# Patient Record
Sex: Male | Born: 1935 | Race: White | Hispanic: No | Marital: Married | Smoking: Never smoker
Health system: Southern US, Community
[De-identification: ages and names within clinical notes are randomized; demographics above are authoritative.]

## PROBLEM LIST (undated history)

## (undated) DIAGNOSIS — F0390 Unspecified dementia without behavioral disturbance: Secondary | ICD-10-CM

## (undated) DIAGNOSIS — G4733 Obstructive sleep apnea (adult) (pediatric): Secondary | ICD-10-CM

## (undated) DIAGNOSIS — I10 Essential (primary) hypertension: Secondary | ICD-10-CM

---

## 2009-11-17 ENCOUNTER — Ambulatory Visit: Payer: Self-pay | Admitting: Internal Medicine

## 2009-12-28 ENCOUNTER — Emergency Department: Payer: Self-pay | Admitting: Emergency Medicine

## 2011-09-25 ENCOUNTER — Emergency Department: Payer: Self-pay | Admitting: *Deleted

## 2011-09-25 LAB — COMPREHENSIVE METABOLIC PANEL
Anion Gap: 8 (ref 7–16)
BUN: 29 mg/dL — ABNORMAL HIGH (ref 7–18)
Bilirubin,Total: 1.5 mg/dL — ABNORMAL HIGH (ref 0.2–1.0)
Chloride: 104 mmol/L (ref 98–107)
Co2: 27 mmol/L (ref 21–32)
Creatinine: 1.22 mg/dL (ref 0.60–1.30)
EGFR (African American): >60
EGFR (Non-African Amer.): 52 — ABNORMAL LOW
Osmolality: 285 (ref 275–301)
Potassium: 4 mmol/L (ref 3.5–5.1)
SGPT (ALT): 19 U/L (ref 12–78)
Sodium: 139 mmol/L (ref 136–145)
Total Protein: 6.9 g/dL (ref 6.4–8.2)

## 2011-09-25 LAB — URINALYSIS, COMPLETE
Bilirubin,UR: NEGATIVE
Blood: NEGATIVE
Glucose,UR: NEGATIVE mg/dL (ref 0–75)
Leukocyte Esterase: NEGATIVE
Ph: 6 (ref 4.5–8.0)
Specific Gravity: 1.015 (ref 1.003–1.030)
WBC UR: <1 /HPF (ref 0–5)

## 2011-09-25 LAB — CBC WITH DIFFERENTIAL/PLATELET
Basophil %: 0.5 %
Eosinophil %: 0.4 %
HGB: 13.9 g/dL (ref 13.0–18.0)
Lymphocyte #: 0.7 10*3/uL — ABNORMAL LOW (ref 1.0–3.6)
MCH: 33.6 pg (ref 26.0–34.0)
Monocyte #: 0.4 x10 3/mm (ref 0.2–1.0)
Monocyte %: 5.1 %
Neutrophil #: 7.3 10*3/uL — ABNORMAL HIGH (ref 1.4–6.5)
Neutrophil %: 85.9 %
RBC: 4.14 10*6/uL — ABNORMAL LOW (ref 4.40–5.90)
WBC: 8.6 10*3/uL (ref 3.8–10.6)

## 2013-06-09 ENCOUNTER — Encounter: Payer: Self-pay | Admitting: Surgery

## 2014-01-03 ENCOUNTER — Emergency Department: Payer: Self-pay | Admitting: Emergency Medicine

## 2014-01-03 LAB — URINALYSIS, COMPLETE
BILIRUBIN, UR: NEGATIVE
Bacteria: NONE SEEN
GLUCOSE, UR: NEGATIVE mg/dL (ref 0–75)
KETONE: NEGATIVE
LEUKOCYTE ESTERASE: NEGATIVE
NITRITE: NEGATIVE
Ph: 6 (ref 4.5–8.0)
Protein: 30
RBC,UR: 44 /HPF (ref 0–5)
Specific Gravity: 1.016 (ref 1.003–1.030)
WBC UR: 1 /HPF (ref 0–5)

## 2014-01-03 LAB — CBC
HCT: 39 % — ABNORMAL LOW (ref 40.0–52.0)
HGB: 12.9 g/dL — ABNORMAL LOW (ref 13.0–18.0)
MCH: 32.3 pg (ref 26.0–34.0)
MCHC: 33.2 g/dL (ref 32.0–36.0)
MCV: 97 fL (ref 80–100)
PLATELETS: 234 10*3/uL (ref 150–440)
RBC: 4 10*6/uL — AB (ref 4.40–5.90)
RDW: 12.8 % (ref 11.5–14.5)
WBC: 9.9 10*3/uL (ref 3.8–10.6)

## 2014-01-03 LAB — TROPONIN I: TROPONIN-I: 0.04 ng/mL

## 2014-01-03 LAB — COMPREHENSIVE METABOLIC PANEL
ALK PHOS: 96 U/L
ALT: 18 U/L
ANION GAP: 7 (ref 7–16)
AST: 23 U/L (ref 15–37)
Albumin: 2.6 g/dL — ABNORMAL LOW (ref 3.4–5.0)
BUN: 27 mg/dL — ABNORMAL HIGH (ref 7–18)
Bilirubin,Total: 2 mg/dL — ABNORMAL HIGH (ref 0.2–1.0)
CO2: 30 mmol/L (ref 21–32)
Calcium, Total: 8.6 mg/dL (ref 8.5–10.1)
Chloride: 106 mmol/L (ref 98–107)
Creatinine: 1.26 mg/dL (ref 0.60–1.30)
EGFR (African American): >60
EGFR (Non-African Amer.): 57 — ABNORMAL LOW
Glucose: 112 mg/dL — ABNORMAL HIGH (ref 65–99)
OSMOLALITY: 291 (ref 275–301)
Potassium: 3.9 mmol/L (ref 3.5–5.1)
Sodium: 143 mmol/L (ref 136–145)
Total Protein: 6.9 g/dL (ref 6.4–8.2)

## 2014-01-05 LAB — URINE CULTURE

## 2014-04-10 ENCOUNTER — Emergency Department: Payer: Self-pay | Admitting: Emergency Medicine

## 2015-02-16 ENCOUNTER — Encounter: Payer: Self-pay | Admitting: Emergency Medicine

## 2015-02-16 ENCOUNTER — Emergency Department
Admission: EM | Admit: 2015-02-16 | Discharge: 2015-02-17 | Disposition: A | Discharge status: Home / Self Care | Payer: Medicare Other | Attending: Emergency Medicine | Admitting: Emergency Medicine

## 2015-02-16 ENCOUNTER — Emergency Department: Payer: Medicare Other

## 2015-02-16 DIAGNOSIS — X58XXXA Exposure to other specified factors, initial encounter: Secondary | ICD-10-CM | POA: Insufficient documentation

## 2015-02-16 DIAGNOSIS — S82392A Other fracture of lower end of left tibia, initial encounter for closed fracture: Secondary | ICD-10-CM | POA: Insufficient documentation

## 2015-02-16 DIAGNOSIS — I10 Essential (primary) hypertension: Secondary | ICD-10-CM | POA: Diagnosis not present

## 2015-02-16 DIAGNOSIS — Y9389 Activity, other specified: Secondary | ICD-10-CM | POA: Diagnosis not present

## 2015-02-16 DIAGNOSIS — Z79899 Other long term (current) drug therapy: Secondary | ICD-10-CM | POA: Insufficient documentation

## 2015-02-16 DIAGNOSIS — S8992XA Unspecified injury of left lower leg, initial encounter: Secondary | ICD-10-CM | POA: Diagnosis present

## 2015-02-16 DIAGNOSIS — Z7982 Long term (current) use of aspirin: Secondary | ICD-10-CM | POA: Insufficient documentation

## 2015-02-16 DIAGNOSIS — Y9289 Other specified places as the place of occurrence of the external cause: Secondary | ICD-10-CM | POA: Insufficient documentation

## 2015-02-16 DIAGNOSIS — Y998 Other external cause status: Secondary | ICD-10-CM | POA: Insufficient documentation

## 2015-02-16 DIAGNOSIS — S8012XA Contusion of left lower leg, initial encounter: Secondary | ICD-10-CM | POA: Diagnosis not present

## 2015-02-16 DIAGNOSIS — S82402A Unspecified fracture of shaft of left fibula, initial encounter for closed fracture: Secondary | ICD-10-CM | POA: Diagnosis not present

## 2015-02-16 DIAGNOSIS — S82202A Unspecified fracture of shaft of left tibia, initial encounter for closed fracture: Secondary | ICD-10-CM

## 2015-02-16 HISTORY — DX: Obstructive sleep apnea (adult) (pediatric): G47.33

## 2015-02-16 HISTORY — DX: Unspecified dementia, unspecified severity, without behavioral disturbance, psychotic disturbance, mood disturbance, and anxiety: F03.90

## 2015-02-16 HISTORY — DX: Essential (primary) hypertension: I10

## 2015-02-16 NOTE — ED Notes (Signed)
Return from XRay

## 2015-02-16 NOTE — ED Provider Notes (Signed)
[facility] Emergency Department Provider Note  ____________________________________________  Time seen: 11:30 PM  I have reviewed the triage vital signs and the nursing notes.   HISTORY  Chief Complaint Leg Injury      HPI Seth Phillips is a 81 y.o. male presents from nursing home with history of acute onset of left leg pain while pivoting. Staff from the nursing home said he noted immediate bruising and deformity. Patient denies any pain at this     Past Medical History  Diagnosis Date  . Dementia   . Hypertension   . OSA (obstructive sleep apnea)     There are no active problems to display for this patient.   History reviewed. No pertinent past surgical history.  Current Outpatient Rx  Name  Route  Sig  Dispense  Refill  . aspirin 81 MG chewable tablet   Oral   Chew 81 mg by mouth daily.         Marland Kitchen. lisinopril (PRINIVIL,ZESTRIL) 10 MG tablet   Oral   Take 10 mg by mouth daily.         . Vitamin D, Ergocalciferol, (DRISDOL) 50000 units CAPS capsule   Oral   Take 50,000 Units by mouth every 7 (seven) days.           Allergies Codeine; Sulfa antibiotics; Vancomycin; and Vicodin  History reviewed. No pertinent family history.  Social History Social History  Substance Use Topics  . Smoking status: Unknown If Ever Smoked  . Smokeless tobacco: None  . Alcohol Use: No    Review of Systems  Constitutional: Negative for fever. Eyes: Negative for visual changes. ENT: Negative for sore throat. Cardiovascular: Negative for chest pain. Respiratory: Negative for shortness of breath. Gastrointestinal: Negative for abdominal pain, vomiting and diarrhea. Genitourinary: Negative for dysuria. Musculoskeletal: Negative for back pain. Positive for left leg pain Skin: Negative for rash. Neurological: Negative for headaches, focal weakness or numbness.   10-point ROS otherwise  negative.  ____________________________________________   PHYSICAL EXAM:  VITAL SIGNS: ED Triage Vitals  Enc Vitals Group     BP 02/16/15 2235 142/96 mmHg     Pulse Rate 02/16/15 2235 94     Resp 02/16/15 2235 18     Temp 02/16/15 2235 97.6 F (36.4 C)     Temp src --      SpO2 02/16/15 2235 98 %     Weight 02/16/15 2235 165 lb (74.844 kg)     Height 02/16/15 2235 5\' 7"  (1.702 m)     Head Cir --      Peak Flow --      Pain Score --      Pain Loc --      Pain Edu? --      Excl. in GC? --     Constitutional: Alert and oriented. Well appearing and in no distress. Eyes: Conjunctivae are normal. PERRL. Normal extraocular movements. ENT   Head: Normocephalic and atraumatic.   Nose: No congestion/rhinnorhea.   Mouth/Throat: Mucous membranes are moist.   Neck: No stridor. Hematological/Lymphatic/Immunilogical: No cervical lymphadenopathy. Cardiovascular: Normal rate, regular rhythm. Normal and symmetric distal pulses are present in all extremities. No murmurs, rubs, or gallops. Respiratory: Normal respiratory effort without tachypnea nor retractions. Breath sounds are clear and equal bilaterally. No wheezes/rales/rhonchi. Gastrointestinal: Soft and nontender. No distention. There is no CVA tenderness. Genitourinary: deferred Musculoskeletal: Left leg pain and tenderness to palpation proximal and distal  Neurologic:  Normal speech and language. No gross  focal neurologic deficits are appreciated. Speech is normal.  Skin:  Skin is warm, dry and intact. No rash noted. Psychiatric: Mood and affect are normal. Speech and behavior are normal. Patient exhibits appropriate insight and judgment.      RADIOLOGY   DG Tibia/Fibula Left (Final result) Result time: 02/16/15 23:33:44   Final result by Rad Results In Interface (02/16/15 23:33:44)   Narrative:   CLINICAL DATA: Heard pop at left lower leg with deformity and bruising, when pivoting. Initial  encounter.  EXAM: LEFT TIBIA AND FIBULA - 2 VIEW  COMPARISON: Left ankle radiographs performed 01/03/2014  FINDINGS: There are mildly displaced oblique fractures involving the distal tibial diaphysis and proximal fibular diaphysis. There is lateral and anterior displacement of the tibial fracture, and medial displacement at the fibular fracture. Mild posterior angulation of the distal tibia is noted. Mild soft tissue swelling is noted.  There is degenerative change at the left knee, with lateral compartment narrowing and sclerotic change. The ankle mortise is grossly unremarkable in appearance. Mild degenerative change is noted at the midfoot. A posterior calcaneal spur is incidentally seen.  IMPRESSION: 1. Mildly displaced oblique fractures involving the distal tibial diaphysis and proximal fibular diaphysis. Lateral and anterior angulation of the tibial fracture, and medial displacement of the fibular fracture. Mild posterior angulation of the distal tibia. 2. Degenerative change at the left knee, with lateral compartment narrowing and sclerotic change. Mild degenerative change at the midfoot.   Electronically Signed By: Roanna Raider M.D. On: 02/16/2015 23:32            INITIAL IMPRESSION / ASSESSMENT AND PLAN / ED COURSE  Pertinent labs & imaging results that were available during my care of the patient were reviewed by me and considered in my medical decision making (see chart for details).    ____________________________________________   FINAL CLINICAL IMPRESSION(S) / ED DIAGNOSES  Final diagnoses:  Closed left tibial fracture, initial encounter  Closed left fibular fracture, initial encounter      Darci Current, MD 02/19/15 [phone]

## 2015-02-16 NOTE — ED Notes (Signed)
Pt was being ambulated back to bed by staff at nh - pivoted pt and heard a pop in his leg with deformity and immediate bruising

## 2015-02-17 NOTE — Discharge Instructions (Signed)
Tibial and Fibular Fracture, Adult °Tibial and fibular fracture is a break in the bones of your lower leg (tibia and fibula). The tibia is the larger of these two bones. The fibula is the smaller of the two bones. It is on the outer side of your leg.  °CAUSES °· Low-energy injuries, such as a fall from ground level. °· High-energy injuries, such as motor vehicle injuries, gunshot wounds, or high-speed sports collisions. °RISK FACTORS °· Jumping activities. °· Repetitive stress, such as long-distance running. °· Participation in sports. °· Osteoporosis. °· Advanced age. °SIGNS AND SYMPTOMS °· Pain. °· Swelling. °· Inability to put weight on your injured leg. °· Bone deformities at the site of your injury. °· Bruising. °DIAGNOSIS  °Tibial and fibular fractures are diagnosed with the use of X-ray exams. °TREATMENT  °If you have a simple fracture of these two bones, they can be treated with simple immobilization. A cast or splint will be used on your leg to keep it from moving while it heals. Then you can begin range-of-motion exercises to regain your knee motion. °HOME CARE INSTRUCTIONS  °· Apply ice to your leg: °¨ Put ice in a plastic bag. °¨ Place a towel between your skin and the bag. °¨ Leave the ice on for 20 minutes, 2-3 times a day. °· If you have a plaster or fiberglass cast: °¨ Do not try to scratch the skin under the cast using sharp or pointed objects. °¨ Check the skin around the cast every day. You may put lotion on any red or sore areas. °¨ Keep your cast dry and clean. °· If you have a plaster splint: °¨ Wear the splint as directed. °¨ You may loosen the elastic around the splint if your toes become numb, tingle, or turn cold or blue. °· Do not put pressure on any part of your cast or splint until it is fully hardened, because it may deform. °· Your cast or splint can be protected during bathing with a plastic bag. Do not lower the cast or splint into water. °· Use crutches as directed. °· Only take  over-the-counter or prescription medicines for pain, discomfort, or fever as directed by your health care provider. °· Follow all instructions given to you by your health care provider. °· Make and keep all follow-up appointments. °SEEK MEDICAL CARE IF: °· Your pain is becoming worse rather than better or is not controlled with medicines. °· You have increased swelling or redness in the foot. °· You begin to lose feeling in your foot or toes. °SEEK IMMEDIATE MEDICAL CARE IF: °· You develop a cold or blue foot or toes on the injured side. °· You develop severe pain in your injured leg, especially if the pain is increased with movement of your toes. °MAKE SURE YOU: °· Understand these instructions. °· Will watch your condition. °· Will get help right away if you are not doing well or get worse. °  °This information is not intended to replace advice given to you by your health care provider. Make sure you discuss any questions you have with your health care provider. °  °Document Released: 10/27/2001 Document Revised: 06/21/2014 Document Reviewed: 09/16/2012 °Elsevier Interactive Patient Education ©2016 [employer] ° °

## 2015-02-17 NOTE — ED Notes (Signed)
Awake and alert.  NAD.  D/C home with EMS

## 2015-02-25 ENCOUNTER — Emergency Department

## 2015-02-25 ENCOUNTER — Inpatient Hospital Stay
Admission: EM | Admit: 2015-02-25 | Discharge: 2015-02-28 | DRG: 871 | Disposition: A | Discharge status: Hospice | Attending: Internal Medicine | Admitting: Internal Medicine

## 2015-02-25 DIAGNOSIS — N179 Acute kidney failure, unspecified: Secondary | ICD-10-CM | POA: Diagnosis present

## 2015-02-25 DIAGNOSIS — S8292XD Unspecified fracture of left lower leg, subsequent encounter for closed fracture with routine healing: Secondary | ICD-10-CM

## 2015-02-25 DIAGNOSIS — M1990 Unspecified osteoarthritis, unspecified site: Secondary | ICD-10-CM | POA: Diagnosis present

## 2015-02-25 DIAGNOSIS — Z8546 Personal history of malignant neoplasm of prostate: Secondary | ICD-10-CM

## 2015-02-25 DIAGNOSIS — Z7982 Long term (current) use of aspirin: Secondary | ICD-10-CM

## 2015-02-25 DIAGNOSIS — E870 Hyperosmolality and hypernatremia: Secondary | ICD-10-CM | POA: Diagnosis present

## 2015-02-25 DIAGNOSIS — N183 Chronic kidney disease, stage 3 (moderate): Secondary | ICD-10-CM | POA: Diagnosis present

## 2015-02-25 DIAGNOSIS — D631 Anemia in chronic kidney disease: Secondary | ICD-10-CM | POA: Diagnosis present

## 2015-02-25 DIAGNOSIS — Z881 Allergy status to other antibiotic agents status: Secondary | ICD-10-CM | POA: Diagnosis not present

## 2015-02-25 DIAGNOSIS — E860 Dehydration: Secondary | ICD-10-CM | POA: Diagnosis present

## 2015-02-25 DIAGNOSIS — I129 Hypertensive chronic kidney disease with stage 1 through stage 4 chronic kidney disease, or unspecified chronic kidney disease: Secondary | ICD-10-CM | POA: Diagnosis present

## 2015-02-25 DIAGNOSIS — A4101 Sepsis due to Methicillin susceptible Staphylococcus aureus: Principal | ICD-10-CM | POA: Diagnosis present

## 2015-02-25 DIAGNOSIS — K1120 Sialoadenitis, unspecified: Secondary | ICD-10-CM | POA: Diagnosis present

## 2015-02-25 DIAGNOSIS — Z885 Allergy status to narcotic agent status: Secondary | ICD-10-CM

## 2015-02-25 DIAGNOSIS — G4733 Obstructive sleep apnea (adult) (pediatric): Secondary | ICD-10-CM | POA: Diagnosis present

## 2015-02-25 DIAGNOSIS — K1121 Acute sialoadenitis: Secondary | ICD-10-CM | POA: Diagnosis present

## 2015-02-25 DIAGNOSIS — F0390 Unspecified dementia without behavioral disturbance: Secondary | ICD-10-CM | POA: Diagnosis present

## 2015-02-25 DIAGNOSIS — Z888 Allergy status to other drugs, medicaments and biological substances status: Secondary | ICD-10-CM

## 2015-02-25 DIAGNOSIS — S82402D Unspecified fracture of shaft of left fibula, subsequent encounter for closed fracture with routine healing: Secondary | ICD-10-CM

## 2015-02-25 DIAGNOSIS — Z882 Allergy status to sulfonamides status: Secondary | ICD-10-CM | POA: Diagnosis not present

## 2015-02-25 DIAGNOSIS — Z79899 Other long term (current) drug therapy: Secondary | ICD-10-CM

## 2015-02-25 DIAGNOSIS — Z66 Do not resuscitate: Secondary | ICD-10-CM | POA: Diagnosis present

## 2015-02-25 DIAGNOSIS — L03211 Cellulitis of face: Secondary | ICD-10-CM | POA: Diagnosis present

## 2015-02-25 DIAGNOSIS — R319 Hematuria, unspecified: Secondary | ICD-10-CM | POA: Diagnosis present

## 2015-02-25 DIAGNOSIS — G9341 Metabolic encephalopathy: Secondary | ICD-10-CM | POA: Diagnosis present

## 2015-02-25 DIAGNOSIS — A419 Sepsis, unspecified organism: Secondary | ICD-10-CM

## 2015-02-25 DIAGNOSIS — S82202D Unspecified fracture of shaft of left tibia, subsequent encounter for closed fracture with routine healing: Secondary | ICD-10-CM

## 2015-02-25 DIAGNOSIS — E785 Hyperlipidemia, unspecified: Secondary | ICD-10-CM | POA: Diagnosis present

## 2015-02-25 DIAGNOSIS — Z8611 Personal history of tuberculosis: Secondary | ICD-10-CM | POA: Diagnosis not present

## 2015-02-25 DIAGNOSIS — Z515 Encounter for palliative care: Secondary | ICD-10-CM | POA: Diagnosis present

## 2015-02-25 LAB — CBC WITH DIFFERENTIAL/PLATELET
Basophils Absolute: 0 10*3/uL (ref 0–0.1)
Basophils Relative: 0 %
EOS ABS: 0 10*3/uL (ref 0–0.7)
Eosinophils Relative: 0 %
HCT: 31.2 % — ABNORMAL LOW (ref 40.0–52.0)
HEMOGLOBIN: 9.8 g/dL — AB (ref 13.0–18.0)
LYMPHS ABS: 0.3 10*3/uL — AB (ref 1.0–3.6)
LYMPHS PCT: 2 %
MCH: 29.5 pg (ref 26.0–34.0)
MCHC: 31.5 g/dL — AB (ref 32.0–36.0)
MCV: 93.8 fL (ref 80.0–100.0)
MONOS PCT: 4 %
Monocytes Absolute: 0.7 10*3/uL (ref 0.2–1.0)
NEUTROS PCT: 94 %
Neutro Abs: 16.6 10*3/uL — ABNORMAL HIGH (ref 1.4–6.5)
Platelets: 342 10*3/uL (ref 150–440)
RBC: 3.32 MIL/uL — AB (ref 4.40–5.90)
RDW: 16.6 % — ABNORMAL HIGH (ref 11.5–14.5)
WBC: 17.6 10*3/uL — ABNORMAL HIGH (ref 3.8–10.6)

## 2015-02-25 LAB — MRSA PCR SCREENING: MRSA by PCR: POSITIVE — AB

## 2015-02-25 LAB — URINALYSIS COMPLETE WITH MICROSCOPIC (ARMC ONLY)
BACTERIA UA: NONE SEEN
Bilirubin Urine: NEGATIVE
GLUCOSE, UA: NEGATIVE mg/dL
Ketones, ur: NEGATIVE mg/dL
LEUKOCYTES UA: NEGATIVE
NITRITE: NEGATIVE
PH: 5 (ref 5.0–8.0)
PROTEIN: NEGATIVE mg/dL
SPECIFIC GRAVITY, URINE: 1.014 (ref 1.005–1.030)

## 2015-02-25 LAB — COMPREHENSIVE METABOLIC PANEL
ALBUMIN: 2.5 g/dL — AB (ref 3.5–5.0)
ALK PHOS: 86 U/L (ref 38–126)
ALT: 9 U/L — AB (ref 17–63)
ANION GAP: 9 (ref 5–15)
AST: 15 U/L (ref 15–41)
BILIRUBIN TOTAL: 1.4 mg/dL — AB (ref 0.3–1.2)
BUN: 78 mg/dL — AB (ref 6–20)
CALCIUM: 8 mg/dL — AB (ref 8.9–10.3)
CO2: 21 mmol/L — AB (ref 22–32)
Chloride: 129 mmol/L — ABNORMAL HIGH (ref 101–111)
Creatinine, Ser: 3.37 mg/dL — ABNORMAL HIGH (ref 0.61–1.24)
GFR calc Af Amer: 17 mL/min — ABNORMAL LOW (ref >60)
GFR calc non Af Amer: 14 mL/min — ABNORMAL LOW (ref >60)
GLUCOSE: 185 mg/dL — AB (ref 65–99)
Potassium: 3.8 mmol/L (ref 3.5–5.1)
SODIUM: 159 mmol/L — AB (ref 135–145)
TOTAL PROTEIN: 6.1 g/dL — AB (ref 6.5–8.1)

## 2015-02-25 LAB — LACTIC ACID, PLASMA
LACTIC ACID, VENOUS: 2.1 mmol/L — AB (ref 0.5–2.0)
Lactic Acid, Venous: 1.8 mmol/L (ref 0.5–2.0)

## 2015-02-25 LAB — LIPASE, BLOOD: Lipase: 25 U/L (ref 11–51)

## 2015-02-25 MED ORDER — MORPHINE SULFATE (PF) 2 MG/ML IV SOLN
2.0000 mg | INTRAVENOUS | Status: DC | PRN
  Administered 2015-02-25 – 2015-02-26 (×5): 2 mg via INTRAVENOUS
  Filled 2015-02-25 (×5): qty 1

## 2015-02-25 MED ORDER — ACETAMINOPHEN 650 MG RE SUPP: 650.0000 mg | Freq: Four times a day (QID) | RECTAL | Status: DC | PRN

## 2015-02-25 MED ORDER — SODIUM CHLORIDE 0.9 % IV BOLUS (SEPSIS)
1000.0000 mL | INTRAVENOUS | Status: AC
  Administered 2015-02-25 (×2): 1000 mL via INTRAVENOUS

## 2015-02-25 MED ORDER — ONDANSETRON HCL 4 MG/2ML IJ SOLN: 4.0000 mg | Freq: Four times a day (QID) | INTRAMUSCULAR | Status: DC | PRN

## 2015-02-25 MED ORDER — SODIUM CHLORIDE 0.45 % IV SOLN
INTRAVENOUS | Status: DC
  Administered 2015-02-25 – 2015-02-26 (×2): via INTRAVENOUS

## 2015-02-25 MED ORDER — SODIUM CHLORIDE 0.9 % IJ SOLN
3.0000 mL | Freq: Two times a day (BID) | INTRAMUSCULAR | Status: DC
  Administered 2015-02-25 (×2): 3 mL via INTRAVENOUS

## 2015-02-25 MED ORDER — ACETAMINOPHEN 10 MG/ML IV SOLN
1000.0000 mg | Freq: Four times a day (QID) | INTRAVENOUS | Status: DC
  Administered 2015-02-25: 1000 mg via INTRAVENOUS
  Filled 2015-02-25 (×3): qty 100

## 2015-02-25 MED ORDER — ACETAMINOPHEN 650 MG RE SUPP: 650.0000 mg | RECTAL | Status: DC | PRN

## 2015-02-25 MED ORDER — CETYLPYRIDINIUM CHLORIDE 0.05 % MT LIQD: 7.0000 mL | Freq: Two times a day (BID) | OROMUCOSAL | Status: DC

## 2015-02-25 MED ORDER — LACTATED RINGERS IV BOLUS (SEPSIS)
1000.0000 mL | Freq: Once | INTRAVENOUS | Status: AC
  Administered 2015-02-25: 1000 mL via INTRAVENOUS

## 2015-02-25 MED ORDER — BISACODYL 10 MG RE SUPP: 10.0000 mg | Freq: Every day | RECTAL | Status: DC | PRN

## 2015-02-25 MED ORDER — ONDANSETRON HCL 4 MG PO TABS: 4.0000 mg | ORAL_TABLET | Freq: Four times a day (QID) | ORAL | Status: DC | PRN

## 2015-02-25 MED ORDER — ACETAMINOPHEN 160 MG/5ML PO SOLN
650.0000 mg | Freq: Four times a day (QID) | ORAL | Status: DC | PRN
Start: 2015-02-25 — End: 2015-02-26
  Filled 2015-02-25: qty 20.3

## 2015-02-25 MED ORDER — PIPERACILLIN-TAZOBACTAM 3.375 G IVPB
3.3750 g | Freq: Two times a day (BID) | INTRAVENOUS | Status: DC
  Administered 2015-02-25 – 2015-02-26 (×2): 3.375 g via INTRAVENOUS
  Filled 2015-02-25 (×2): qty 50

## 2015-02-25 MED ORDER — DEXTROSE 5 % IV SOLN
2.0000 g | Freq: Once | INTRAVENOUS | Status: AC
  Administered 2015-02-25: 2 g via INTRAVENOUS
  Filled 2015-02-25 (×2): qty 2

## 2015-02-25 MED ORDER — SODIUM CHLORIDE 0.45 % IV SOLN
INTRAVENOUS | Status: DC
  Administered 2015-02-25: 13:00:00 via INTRAVENOUS

## 2015-02-25 MED ORDER — HEPARIN SODIUM (PORCINE) 5000 UNIT/ML IJ SOLN
5000.0000 [IU] | Freq: Three times a day (TID) | INTRAMUSCULAR | Status: DC
  Administered 2015-02-25: 18:00:00 5000 [IU] via SUBCUTANEOUS
  Filled 2015-02-25: qty 1

## 2015-02-25 MED ORDER — PENTAFLUOROPROP-TETRAFLUOROETH EX AERO
INHALATION_SPRAY | CUTANEOUS | Status: AC
  Filled 2015-02-25: qty 30

## 2015-02-25 MED ORDER — SODIUM CHLORIDE 0.9 % IV BOLUS (SEPSIS): 500.0000 mL | INTRAVENOUS | Status: DC

## 2015-02-25 MED ORDER — ASPIRIN 81 MG PO CHEW
81.0000 mg | CHEWABLE_TABLET | Freq: Every day | ORAL | Status: DC
Start: 2015-02-25 — End: 2015-02-26

## 2015-02-25 MED ORDER — LISINOPRIL 10 MG PO TABS
10.0000 mg | ORAL_TABLET | Freq: Every day | ORAL | Status: DC
Start: 2015-02-25 — End: 2015-02-25

## 2015-02-25 MED ORDER — DEXTROSE 5 % IV SOLN
INTRAVENOUS | Status: DC
  Administered 2015-02-25: 15:00:00 via INTRAVENOUS

## 2015-02-25 MED ORDER — DEXTROSE 5 % IV SOLN: 2.0000 g | INTRAVENOUS | Status: DC

## 2015-02-25 MED ORDER — DEXTROSE 5 % IV SOLN: 1.0000 g | INTRAVENOUS | Status: DC

## 2015-02-25 MED ORDER — ACETAMINOPHEN 325 MG PO TABS: 650.0000 mg | ORAL_TABLET | Freq: Four times a day (QID) | ORAL | Status: DC | PRN

## 2015-02-25 MED ORDER — DOCUSATE SODIUM 100 MG PO CAPS: 100.0000 mg | ORAL_CAPSULE | Freq: Two times a day (BID) | ORAL | Status: DC

## 2015-02-25 NOTE — Progress Notes (Signed)
Speech Therapy Note: ST will f/u w/ BSE when appropriate. NSG updated and agreed.

## 2015-02-25 NOTE — Evaluation (Signed)
Speech Therapy Note: received order; reviewed chart notes; consulted NSG re: pt's status. Pt currently is less alert/responsive w/ min. Increased respiratory effort at rest; noted increased facial swelling. Pt does not appear appropriate, or safe, for po trials for BSE at this time.

## 2015-02-25 NOTE — Progress Notes (Signed)
ANTIBIOTIC CONSULT NOTE - INITIAL  Pharmacy Consult for Ceftriaxone Indication: Sepsis  Allergies  Allergen Reactions  . Codeine   . Sulfa Antibiotics   . Vancomycin   . Vicodin [Hydrocodone-Acetaminophen]     Patient Measurements: Height: 5\' 9"  (175.3 cm) Weight: 170 lb (77.111 kg) IBW/kg (Calculated) : 70.7  Vital Signs: Temp: 100.4 F (38 C) (01/07 1100) BP: 127/92 mmHg (01/07 1100) Pulse Rate: 127 (01/07 1030) Intake/Output from previous day:   Intake/Output from this shift: Total I/O In: -  Out: 300 [Urine:300]  Labs:  Recent Labs  02/25/15 0853  WBC 17.6*  HGB 9.8*  PLT 342   CrCl cannot be calculated (Patient has no serum creatinine result on file.). No results for input(s): VANCOTROUGH, VANCOPEAK, VANCORANDOM, GENTTROUGH, GENTPEAK, GENTRANDOM, TOBRATROUGH, TOBRAPEAK, TOBRARND, AMIKACINPEAK, AMIKACINTROU, AMIKACIN in the last 72 hours.   Microbiology: No results found for this or any previous visit (from the past 720 hour(s)).  Medical History: Past Medical History  Diagnosis Date  . Dementia   . Hypertension   . OSA (obstructive sleep apnea)      Assessment: 82 yo male starting on ceftriaxone for sepsis. Pt received ceftriaxone 2g IV x1 in the ED.   Goal of Therapy:  Resolution of infection  Plan:  Will continue dosing with ceftriaxone 2 g IV q24h.   Pharmacy will continue to follow.   Crist FatWang, Mariyana Fulop L 02/25/2015,11:13 AM

## 2015-02-25 NOTE — Consult Note (Signed)
[facility]  CONSULT NOTE    Date: 02/25/2015                  Patient Name:  Seth Phillips  MRN: [id number]  DOB: 03/12/1919  Age / Sex: 82 y.o., male         PCP: Lauro Regulus., MD                 Service Requesting Consult: Dr. Judithann Sheen                 Reason for Consult: Acute Renal Failure            History of Present Illness: Mr. Canaan Holzer is a 82 y.o. white  male SNF resident with dementia, hypertension, obstructive sleep apnea, hyperlipidemia, prostate cancer status post surgery, history of tuberculosis, osteoarthritis, who was admitted to [facility] on 02/25/2015 for Hypernatremia [E87.0] Tibia/fibula fracture, left, closed, with routine healing, subsequent encounter [S82.92XD] Sepsis, due to unspecified organism [facility]) [A41.9] Acute renal failure, unspecified acute renal failure type (HCC) [N17.9]  Patient found to have parotitis and facial swelling. Seems to not been able to eat anything for several days. He has baseline dementia and is nonverbal at baseline. As per nursing, he is in hospice.  Started on zosyn.   ENT has recommend patient to get aggressive IV fluids. Sodium on admission of 163 due to free water deficit. Started on 1/2NS.   Nephrology consulted for acute renal failure with creatinine of 3.35 with baseline of 1.2   Medications: Outpatient medications: Prescriptions prior to admission  Medication Sig Dispense Refill Last Dose  . aspirin 81 MG chewable tablet Chew 81 mg by mouth daily.   02/25/2015 at Unknown time  . fentaNYL (DURAGESIC - DOSED MCG/HR) 25 MCG/HR patch Place 25 mcg onto the skin every 3 (three) days.   02/25/2015 at Unknown time  . lisinopril (PRINIVIL,ZESTRIL) 10 MG tablet Take 10 mg by mouth daily.   02/25/2015 at Unknown time  . traMADol (ULTRAM) 50 MG tablet Take 50 mg by mouth every 6 (six) hours as needed.   02/25/2015 at Unknown time  . Vitamin D, Ergocalciferol, (DRISDOL) 50000 units CAPS capsule Take  50,000 Units by mouth every 7 (seven) days.   Past Week at Unknown time    Current medications: Current Facility-Administered Medications  Medication Dose Route Frequency Provider Last Rate Last Dose  . 0.45 % sodium chloride infusion   Intravenous Continuous Marguarite Arbour, MD 100 mL/hr at 02/25/15 1636    . acetaminophen (TYLENOL) solution 650 mg  650 mg Oral Q6H PRN Marguarite Arbour, MD       Or  . acetaminophen (TYLENOL) suppository 650 mg  650 mg Rectal Q4H PRN Marguarite Arbour, MD      . antiseptic oral rinse (CPC / CETYLPYRIDINIUM CHLORIDE 0.05%) solution 7 mL  7 mL Mouth Rinse q12n4p Sital Mody, MD      . aspirin chewable tablet 81 mg  81 mg Oral Daily Marguarite Arbour, MD   81 mg at 02/25/15 1402  . bisacodyl (DULCOLAX) suppository 10 mg  10 mg Rectal Daily PRN Marguarite Arbour, MD      . docusate sodium (COLACE) capsule 100 mg  100 mg Oral BID Marguarite Arbour, MD   100 mg at 02/25/15 1636  . heparin injection 5,000 Units  5,000 Units Subcutaneous 3 times per day Marguarite Arbour, MD      . morphine 2 MG/ML injection  2 mg  2 mg Intravenous Q2H PRN Marguarite Arbour, MD      . ondansetron [facility]) tablet 4 mg  4 mg Oral Q6H PRN Marguarite Arbour, MD       Or  . ondansetron [facility]) injection 4 mg  4 mg Intravenous Q6H PRN Marguarite Arbour, MD      . piperacillin-tazobactam (ZOSYN) IVPB 3.375 g  3.375 g Intravenous Q12H Marguarite Arbour, MD      . sodium chloride 0.9 % injection 3 mL  3 mL Intravenous Q12H Marguarite Arbour, MD   3 mL at 02/25/15 1637      Allergies: Allergies  Allergen Reactions  . Codeine   . Sulfa Antibiotics   . Vancomycin   . Vicodin [Hydrocodone-Acetaminophen]       Past Medical History: Past Medical History  Diagnosis Date  . Dementia   . Hypertension   . OSA (obstructive sleep apnea)      Past Surgical History: History reviewed. No pertinent past surgical history.   Family History: History reviewed. No pertinent family  history.   Social History: Social History   Social History  . Marital Status: Married    Spouse Name: N/A  . Number of Children: N/A  . Years of Education: N/A   Occupational History  . Not on file.   Social History Main Topics  . Smoking status: Unknown If Ever Smoked  . Smokeless tobacco: Never Used     Comment: unknown  . Alcohol Use: No  . Drug Use: No  . Sexual Activity: Not Currently   Other Topics Concern  . Not on file   Social History Narrative     Review of Systems: Review of Systems  Unable to perform ROS: dementia    Vital Signs: Blood pressure 116/62, pulse 110, temperature 98.5 F (36.9 C), temperature source Axillary, resp. rate 24, height 5\' 9"  (1.753 m), weight 58.514 kg (129 lb), SpO2 98 %.  Weight trends: Filed Weights   02/25/15 0845 02/25/15 1428  Weight: 77.111 kg (170 lb) 58.514 kg (129 lb)    Physical Exam: General: In distress  Head: + bilateral parotid swelling with tenderness  Eyes: Anicteric, PERRL  Neck: Supple, trachea midline  Lungs:  Course breath sounds, bilateral crackles  Heart: Regular rate and rhythm  Abdomen:  Soft, nontender,   Extremities: trace peripheral edema.  Neurologic: Not oriented  Skin: No lesions        Lab results: Basic Metabolic Panel:  Recent Labs Lab 02/25/15 0853 02/25/15 1019  NA 163* 159*  K 4.3 3.8  CL >130* 129*  CO2 22 21*  GLUCOSE 176* 185*  BUN 82* 78*  CREATININE 3.35* 3.37*  CALCIUM 8.9 8.0*    Liver Function Tests:  Recent Labs Lab 02/25/15 0853 02/25/15 1019  AST 16 15  ALT 11* 9*  ALKPHOS 94 86  BILITOT 1.9* 1.4*  PROT 7.4 6.1*  ALBUMIN 3.1* 2.5*    Recent Labs Lab 02/25/15 0853  LIPASE 25   No results for input(s): AMMONIA in the last 168 hours.  CBC:  Recent Labs Lab 02/25/15 0853  WBC 17.6*  NEUTROABS 16.6*  HGB 9.8*  HCT 31.2*  MCV 93.8  PLT 342    Cardiac Enzymes: No results for input(s): CKTOTAL, CKMB, CKMBINDEX, TROPONINI in the  last 168 hours.  BNP: Invalid input(s): POCBNP  CBG: No results for input(s): GLUCAP in the last 168 hours.  Microbiology: Results for orders placed or performed during  the hospital encounter of 02/25/15  MRSA PCR Screening     Status: Abnormal   Collection Time: 02/25/15  3:16 PM  Result Value Ref Range Status   MRSA by PCR POSITIVE (A) NEGATIVE Final    Comment: CRITICAL RESULT CALLED TO, READ BACK BY AND VERIFIED WITH: MEGAN OAKLEY @ 1646 ON 02/25/2015 BY CAF        The GeneXpert MRSA Assay (FDA approved for NASAL specimens only), is one component of a comprehensive MRSA colonization surveillance program. It is not intended to diagnose MRSA infection nor to guide or monitor treatment for MRSA infections.     Coagulation Studies: No results for input(s): LABPROT, INR in the last 72 hours.  Urinalysis:  Recent Labs  02/25/15 0853  COLORURINE YELLOW*  LABSPEC 1.014  PHURINE 5.0  GLUCOSEU NEGATIVE  HGBUR 3+*  BILIRUBINUR NEGATIVE  KETONESUR NEGATIVE  PROTEINUR NEGATIVE  NITRITE NEGATIVE  LEUKOCYTESUR NEGATIVE      Imaging: Dg Chest Port 1 View  02/25/2015  CLINICAL DATA:  Bilateral facial swelling and altered mental status. Baseline dementia. Additional history of hypertension. EXAM: PORTABLE CHEST 1 VIEW COMPARISON:  Chest x-ray dated 04/10/2014. FINDINGS: Study is hypoinspiratory with crowding of the perihilar and basilar bronchovascular markings. Given the low lung volumes, lungs appear clear. No evidence of pneumonia seen. No pleural effusion seen. Mild cardiomegaly, likely accentuated by the low lung volumes. Overall cardiomediastinal silhouette appears stable in size and configuration. Atherosclerotic changes again seen along the walls of the ectatic thoracic aorta. Osteopenia limits characterization of osseous detail. No acute-appearing osseous abnormality seen. IMPRESSION: Hypoinspiratory exam. No evidence of acute cardiopulmonary abnormality. Electronically  Signed   By: Bary RichardStan  Maynard M.D.   On: 02/25/2015 09:31      Assessment & Plan: Mr. Wendelyn BreslowSherwood Amrhein is a 82 y.o. white  male SNF resident with dementia, hypertension, obstructive sleep apnea, hyperlipidemia, prostate cancer status post surgery, history of tuberculosis, osteoarthritis, who was admitted to [facility] on 02/25/2015   1. Acute Renal Failure: on chronic kidney disease stage III with baseline creatinine of 1.2 from 2015. Acute renal failure from prerenal azotemia from poor PO intake and now with sepsis and hypotension - Agree with IV fluids. ENT is recommending aggressive IV fluids to help with parotitis.  - Monitor volume status as his respiratory exam does have crackles bilaterally  2. Hypernatremia: with free water deficit of 4.8 litres - 1/2NS   3. Sepsis: bilateral parotitis. ENT to discuss further care - parotid massage. IV pip/tazo  4. Anemia with kidney failure: hemoglobin 9.8 - suspect this will drop further as he is given IV fluids.  LOS: 0 Laraina Sulton 1/7/20175:37 PM

## 2015-02-25 NOTE — ED Notes (Signed)
X ray in room.

## 2015-02-25 NOTE — Consult Note (Signed)
..   Seth Phillips, [name] 09/07/1919 Seth Phillips, Seth Phillips  Reason for Consult: Parotitis, questionable bilateral parotid abcess  HPI: 82 y.o. Demented male on hopice and DNR admitted for acute facial swelling and parotitis.  Begun on IV antibiotics.  Patient is non-verbal and per chart was in SNF prior to admission.  Per nursing patient placed on hospice yesterday.  Allergies:  Allergies  Allergen Reactions  . Codeine   . Sulfa Antibiotics   . Vancomycin   . Vicodin [Hydrocodone-Acetaminophen]     ROS: Review of systems normal other than 12 systems except per HPI.  PMH:  Past Medical History  Diagnosis Date  . Dementia   . Hypertension   . OSA (obstructive sleep apnea)     FH: History reviewed. No pertinent family history.  SH:  Social History   Social History  . Marital Status: Married    Spouse Name: N/A  . Number of Children: N/A  . Years of Education: N/A   Occupational History  . Not on file.   Social History Main Topics  . Smoking status: Unknown If Ever Smoked  . Smokeless tobacco: Never Used     Comment: unknown  . Alcohol Use: No  . Drug Use: No  . Sexual Activity: Not Currently   Other Topics Concern  . Not on file   Social History Narrative    PSH: History reviewed. No pertinent past surgical history.  Physical  Exam: GEN- non-verbal and nonresponsive except to pain stimuli. EARS- external ears clear OC/OP- severe dryness with crusting in oral cavity, purulence from bilateral Wharton's ducts FACE- Bilateral parotid fullness with fluctuance on left and induration and erythema bilaterally NECK- Skin dry, bilateral reactive lymphadenopathy NOSE- nasal mucosa dry and crusting  A/P: Bilateral Parotitis possible Abscess bilaterally, Dementia, ARF.  1)  Patient allergic to Sulfa and Vancomycin from chart, recommend Zosyn and will get cultures to see what grows out.  May need Imipenem if MRSA.  2)  Very sick patient with poor prognosis.  Most  important step other than IV abx will be hydration.  Discussed with Dr. Judithann SheenSparks regarding increasing fluids from current level of 50.  3)  Instructed nurse on Parotid Massage and will need 4 to 5 times a day  4)  Oral hygiene with Lemon glycerin swabs to try and improve saliva flow  5)  Consider CT scan if patient's family wishes to proceed with intervention if abscess is present.  Patient would not be an operative candidate, but could possibly have interventional radiology drain a fluid collection if present.   Seth Phillips 02/25/2015 4:03 PM

## 2015-02-25 NOTE — H&P (Signed)
History and Physical    Seth BreslowSherwood Phillips ZOX:[id number] DOB: 09/21/1919 DOA: 02/25/2015  Referring physician: Dr. Carollee MassedKaminski PCP: Lauro RegulusANDERSON,[name], MD  Specialists: none  Chief Complaint: facial swelling  HPI: Seth Phillips is a 82 y.o. male has a past medical history significant for dementia and HTN who is non-verbal at baseline sent to the ER from SNF with fever and facial swelling. Was in ER last week with LE fracture. Now, pt has severe bilateral facial swelling and is febrile and tachycardic. WBC elevated. Labs show dehydration with acute renal failure. The patient is non-verbal and unable to provide history or ROS. He is now admitted for further evaluation.  Review of Systems: unable to obtain due to dementia and non-verbal status   Past Medical History  Diagnosis Date  . Dementia   . Hypertension   . OSA (obstructive sleep apnea)    History reviewed. No pertinent past surgical history. Social History:  reports that he does not drink alcohol. His tobacco and drug histories are not on file.  Allergies  Allergen Reactions  . Codeine   . Sulfa Antibiotics   . Vancomycin   . Vicodin [Hydrocodone-Acetaminophen]     History reviewed. No pertinent family history.  Prior to Admission medications   Medication Sig Start Date End Date Taking? Authorizing Provider  aspirin 81 MG chewable tablet Chew 81 mg by mouth daily.    Historical Provider, MD  lisinopril (PRINIVIL,ZESTRIL) 10 MG tablet Take 10 mg by mouth daily.    Historical Provider, MD  Vitamin D, Ergocalciferol, (DRISDOL) 50000 units CAPS capsule Take 50,000 Units by mouth every 7 (seven) days.    Historical Provider, MD   Physical Exam: Filed Vitals:   02/25/15 0845 02/25/15 0930 02/25/15 1015  BP: 127/98 160/90 124/73  Pulse: 121 64 108  Temp:   101.3 F (38.5 C)  Resp: 24 26 22   Height: 5\' 9"  (1.753 m)    Weight: 77.111 kg (170 lb)    SpO2: 100% 81% 100%     General: moderate distress with bilateral  facial edema noted  Eyes: PERRL, EOMI, no scleral icterus  ENT: dry oropharynx with poor dentition  Neck: supple, diffuse submandibular lymphadenopathy noted with parotid gland enlargement  Cardiovascular: rapid rate with regular rhythm with 2/6 SEM noted. No rubs or gallops.; 2+ peripheral pulses, no JVD, no peripheral edema  Respiratory: basilar rhonchi without wheezes or rales  Abdomen: soft, non tender to palpation, positive bowel sounds, no guarding, no rebound  Skin: no rashes  Musculoskeletal: normal bulk and tone, no joint swelling. LLE bruised and tender below knee  Psychiatric: unable to assess due to dementia and non-verbal status  Neurologic: CN 2-12 grossly intact, MS 5/5 in all 4  Labs on Admission:  Basic Metabolic Panel: No results for input(s): NA, K, CL, CO2, GLUCOSE, BUN, CREATININE, CALCIUM, MG, PHOS in the last 168 hours. Liver Function Tests: No results for input(s): AST, ALT, ALKPHOS, BILITOT, PROT, ALBUMIN in the last 168 hours. No results for input(s): LIPASE, AMYLASE in the last 168 hours. No results for input(s): AMMONIA in the last 168 hours. CBC:  Recent Labs Lab 02/25/15 0853  WBC 17.6*  NEUTROABS 16.6*  HGB 9.8*  HCT 31.2*  MCV 93.8  PLT 342   Cardiac Enzymes: No results for input(s): CKTOTAL, CKMB, CKMBINDEX, TROPONINI in the last 168 hours.  BNP (last 3 results) No results for input(s): BNP in the last 8760 hours.  ProBNP (last 3 results) No results for input(s): PROBNP  in the last 8760 hours.  CBG: No results for input(s): GLUCAP in the last 168 hours.  Radiological Exams on Admission: Dg Chest Port 1 View  02/25/2015  CLINICAL DATA:  Bilateral facial swelling and altered mental status. Baseline dementia. Additional history of hypertension. EXAM: PORTABLE CHEST 1 VIEW COMPARISON:  Chest x-ray dated 04/10/2014. FINDINGS: Study is hypoinspiratory with crowding of the perihilar and basilar bronchovascular markings. Given the low  lung volumes, lungs appear clear. No evidence of pneumonia seen. No pleural effusion seen. Mild cardiomegaly, likely accentuated by the low lung volumes. Overall cardiomediastinal silhouette appears stable in size and configuration. Atherosclerotic changes again seen along the walls of the ectatic thoracic aorta. Osteopenia limits characterization of osseous detail. No acute-appearing osseous abnormality seen. IMPRESSION: Hypoinspiratory exam. No evidence of acute cardiopulmonary abnormality. Electronically Signed   By: Bary Richard M.D.   On: 02/25/2015 09:31    EKG: Independently reviewed.  Assessment/Plan Principal Problem:   Sepsis (HCC) Active Problems:   Parotitis, acute   ARF (acute renal failure) (HCC)   Dehydration   Will admit to floor as DNR and begin IV fluids and IV ABX. Cultures sent. Will consult ENT, Ortho, Nephrology. Will also ask ST, PT, and CSW to evaluate. Repeat labs in AM. Prognosis poor. No family present.  Diet: NPO Fluids: 1/2 NS@125  DVT Prophylaxis: SQ Heparin  Code Status: DNR  Family Communication: none  Disposition Plan: SNF  Time spent: 55 min

## 2015-02-25 NOTE — Progress Notes (Signed)
ANTIBIOTIC CONSULT NOTE - INITIAL  Pharmacy Consult for Zosyn Indication: rule out sepsis  Allergies  Allergen Reactions  . Codeine   . Sulfa Antibiotics   . Vancomycin   . Vicodin [Hydrocodone-Acetaminophen]     Patient Measurements: Height: 5\' 9"  (175.3 cm) Weight: 129 lb (58.514 kg) IBW/kg (Calculated) : 70.7 Adjusted Body Weight: na  Vital Signs: Temp: 98.5 F (36.9 C) (01/07 1428) Temp Source: Axillary (01/07 1428) BP: 116/62 mmHg (01/07 1428) Pulse Rate: 110 (01/07 1428) Intake/Output from previous day:   Intake/Output from this shift: Total I/O In: 2250 [I.V.:2250] Out: 950 [Urine:950]  Labs:  Recent Labs  02/25/15 0853 02/25/15 1019  WBC 17.6*  --   HGB 9.8*  --   PLT 342  --   CREATININE 3.35* 3.37*   Estimated Creatinine Clearance: 11.3 mL/min (by C-G formula based on Cr of 3.37). No results for input(s): VANCOTROUGH, VANCOPEAK, VANCORANDOM, GENTTROUGH, GENTPEAK, GENTRANDOM, TOBRATROUGH, TOBRAPEAK, TOBRARND, AMIKACINPEAK, AMIKACINTROU, AMIKACIN in the last 72 hours.   Microbiology: No results found for this or any previous visit (from the past 720 hour(s)).  Medical History: Past Medical History  Diagnosis Date  . Dementia   . Hypertension   . OSA (obstructive sleep apnea)     Medications:  Anti-infectives    Start     Dose/Rate Route Frequency Ordered Stop   02/26/15 1000  cefTRIAXone (ROCEPHIN) 1 g in dextrose 5 % 50 mL IVPB  Status:  Discontinued     1 g 100 mL/hr over 30 Minutes Intravenous Every 24 hours 02/25/15 1113 02/25/15 1121   02/26/15 1000  cefTRIAXone (ROCEPHIN) 2 g in dextrose 5 % 50 mL IVPB  Status:  Discontinued     2 g 100 mL/hr over 30 Minutes Intravenous Every 24 hours 02/25/15 1121 02/25/15 1618   02/25/15 1700  piperacillin-tazobactam (ZOSYN) IVPB 3.375 g     3.375 g 12.5 mL/hr over 240 Minutes Intravenous Every 12 hours 02/25/15 1635     02/25/15 0900  cefTRIAXone (ROCEPHIN) 2 g in dextrose 5 % 50 mL IVPB     2  g 100 mL/hr over 30 Minutes Intravenous  Once 02/25/15 0857 02/25/15 1005     Assessment: Patient is a 82yo male admittted for sepsis with bilateral parotitis with possible abscess. ENT recommends Zosyn. Pharmacy consulted for dosing.  Goal of Therapy:  Resolution of symptoms  Plan:  Follow up culture results Will order Zosyn 3.375g IV q12h for reduced CrCl below 4020ml/min.  Clovia CuffLisa Oran Dillenburg, PharmD, BCPS 02/25/2015 4:45 PM

## 2015-02-25 NOTE — ED Notes (Signed)
Called pharmacy for antibiotic. Will administered when received.

## 2015-02-25 NOTE — ED Notes (Signed)
[facility] with c/o bilateral facial swelling and AMS. Baseline dementia. DNR in hand. Hot to touch.

## 2015-02-25 NOTE — ED Provider Notes (Addendum)
[facility] Emergency Department Provider Note  ____________________________________________  Time seen: On arrival at 8:40  I have reviewed the triage vital signs and the nursing notes.  History by:  Paramedics (patient is nonverbal)  HISTORY  Chief Complaint Fever  bilateral facial swelling    HPI Seth Phillips is a 82 y.o. male with dementia who resides at [facility] care facility. EMS was called because the patient felt warm to the touch. They report he has bilateral facial swelling that began last night.  On arrival, patient's temperature is 101.4  I am told the patient is generally nonverbal. Baseline function would include him responding to touch or other stimuli and asking "what?".   Patient was seen in the emergency department recently with left leg pain that occurred while pivoting. He was diagnosed with a fracture of the distal tibia and fibula.     Past Medical History  Diagnosis Date  . Dementia   . Hypertension   . OSA (obstructive sleep apnea)     Patient Active Problem List   Diagnosis Date Noted  . Sepsis (HCC) 02/25/2015  . Parotitis, acute 02/25/2015  . ARF (acute renal failure) (HCC) 02/25/2015  . Dehydration 02/25/2015    History reviewed. No pertinent past surgical history.  Current Outpatient Rx  Name  Route  Sig  Dispense  Refill  . aspirin 81 MG chewable tablet   Oral   Chew 81 mg by mouth daily.         Marland Kitchen lisinopril (PRINIVIL,ZESTRIL) 10 MG tablet   Oral   Take 10 mg by mouth daily.         . Vitamin D, Ergocalciferol, (DRISDOL) 50000 units CAPS capsule   Oral   Take 50,000 Units by mouth every 7 (seven) days.           Allergies Codeine; Sulfa antibiotics; Vancomycin; and Vicodin  History reviewed. No pertinent family history.  Social History Social History  Substance Use Topics  . Smoking status: Unknown If Ever Smoked  . Smokeless tobacco: None  . Alcohol Use: No    Review of  Systems Review of systems not possible given the patient's nonverbal status. Level V caveat  ____________________________________________   PHYSICAL EXAM:  VITAL SIGNS: ED Triage Vitals  Enc Vitals Group     BP 02/25/15 0845 127/98 mmHg     Pulse Rate 02/25/15 0845 121     Resp 02/25/15 0845 24     Temp --      Temp src --      SpO2 02/25/15 0845 100 %     Weight 02/25/15 0845 170 lb (77.111 kg)     Height 02/25/15 0845 5\' 9"  (1.753 m)     Head Cir --      Peak Flow --      Pain Score --      Pain Loc --      Pain Edu? --      Excl. in GC? --     Constitutional:  Alert and oriented. Well appearing and in no distress. ENT   Head: Normocephalic and atraumatic.      Face: Extremely enlarged parotid glands bilaterally.       Mouth: Dry mucosal surface and dry tongue. Some residual food and teeth. No noted intraoral swelling. Cardiovascular: Variable heart rate, initially in the 70s and 80s, but with any agitation the heart rate goes to 130. regular rhythm, no murmur noted Respiratory:  Normal respiratory effort, no tachypnea.  Breath sounds are clear and equal bilaterally.  Gastrointestinal: Soft, no distention. Nontender Musculoskeletal: Splint on lower left leg. Remainder of musculoskeletal exam appears stable. Pelvis is stable with no apparent sign of pain. Hips are stable.  Neurologic:  Patient responds to stimuli, often with a groan or moan. He does move both his arms when agitated.  Skin:  Skin is warm, dry. No rash noted. There are yellowish ecchymotic areas on the left lower leg after removal of the splint. No areas of erythema or areas that are suspicious for abscess. ____________________________________________    LABS (pertinent positives/negatives)  Labs Reviewed  CBC WITH DIFFERENTIAL/PLATELET - Abnormal; Notable for the following:    WBC 17.6 (*)    RBC 3.32 (*)    Hemoglobin 9.8 (*)    HCT 31.2 (*)    MCHC 31.5 (*)    RDW 16.6 (*)    Neutro Abs 16.6  (*)    Lymphs Abs 0.3 (*)    All other components within normal limits  LACTIC ACID, PLASMA - Abnormal; Notable for the following:    Lactic Acid, Venous 2.1 (*)    All other components within normal limits  URINALYSIS COMPLETEWITH MICROSCOPIC ([facility] ONLY) - Abnormal; Notable for the following:    Color, Urine YELLOW (*)    APPearance CLEAR (*)    Hgb urine dipstick 3+ (*)    Squamous Epithelial / LPF 0-5 (*)    All other components within normal limits  CULTURE, BLOOD (ROUTINE X 2)  CULTURE, BLOOD (ROUTINE X 2)  URINE CULTURE  COMPREHENSIVE METABOLIC PANEL  LACTIC ACID, PLASMA  LIPASE, BLOOD  COMPREHENSIVE METABOLIC PANEL     ____________________________________________   EKG  ED ECG REPORT I, Nickolas Chalfin W, the attending physician, personally viewed and interpreted this ECG.   Date: 02/25/2015  EKG Time: 8:48 AM  Rate: 131  Rhythm: Sinus tachycardia with occasional PVC. - Abnormal EKG  Axis: Normal  Intervals: Normal  ST&T Change: None noted   ____________________________________________    RADIOLOGY  Chest x-ray: IMPRESSION: Hypoinspiratory exam. No evidence of acute cardiopulmonary abnormality. ____________________________________________   PROCEDURES  CRITICAL CARE Performed by: Darien Ramus   Total critical care time: 40 minutes due to patient's sepsis and critical condition.  Critical care time was exclusive of separately billable procedures and treating other patients.  Critical care was necessary to treat or prevent imminent or life-threatening deterioration.  Critical care was time spent personally by me on the following activities: development of treatment plan with patient and/or surrogate as well as nursing, discussions with consultants, evaluation of patient's response to treatment, examination of patient, obtaining history from patient or surrogate, ordering and performing treatments and interventions, ordering and review of  laboratory studies, ordering and review of radiographic studies, pulse oximetry and re-evaluation of patient's condition.    The old splint was removed to examine the left lower leg. I opted to replace the old splint which she used 4 inch Ortho-Glass with 3 inch Ortho-Glass, primarily to change any pressure spots that he may have due to the splint. Splinting: SPLINT APPLICATION Date/Time: 10:46 Consent: Verbal consent not possible . Splint applied by: Emergency physician, Janalyn Harder Location details: Left lower leg  Splint type: Posterior and stirrup splint, Ortho-Glass  Supplies used: Padding, 3 inch Ortho-Glass, Ace wraps.  Post-procedure: The splinted body part was neurovascularly unchanged following the procedure. Patient tolerance: Patient tolerated the procedure well with no immediate complications.     ____________________________________________   INITIAL IMPRESSION / ASSESSMENT AND  PLAN / ED COURSE  Pertinent labs & imaging results that were available during my care of the patient were reviewed by me and considered in my medical decision making (see chart for details).  Ill appearing 82 year old male with a temperature 101.4. He is warm to touch. On examination, he has a very strong foul odor of urine when checking his genitals. He has impressively enlarged parotid glands bilaterally. We will obtain cultures and begin ceftriaxone as we seek to confirm the source of infection. Patient will receive an appropriate sepsis protocol fluid bolus. We will seek admission to the hospital after his emergency department evaluation is complete.  ----------------------------------------- 10:16 AM on 02/25/2015 -----------------------------------------  Patient's pulse rate has eased to 64. Blood pressure has been stable. His lactic acid is 2.1. White blood cell count of 17,000. Chest x-ray does not show any focal infiltrate. Urine shows red blood cells but no white blood cells. Patient  has been treated with a dose of ceftriaxone, 2 g. Metabolic panel pending.  ----------------------------------------- 10:25 AM on 02/25/2015 -----------------------------------------  Laboratory had called and requested a redraw for the metabolic panel due to an elevated chloride level. They suspected line/fluid contamination. I called back to find out that the sodium was elevated as well as the BUN and creatinine on this initial draw. I suspect that the numbers are overall accurate and that the patient has hypernatremia.   I have spoken with Dr. Judithann SheenSparks about admitting the patient. He'll see the patient emergency department. He requested we switch from normal saline to lactated Ringer's. ____________________________________________   FINAL CLINICAL IMPRESSION(S) / ED DIAGNOSES  Final diagnoses:  Sepsis, due to unspecified organism (HCC)  Hypernatremia  Acute renal failure, unspecified acute renal failure type [facility](HCC)  Tibia/fibula fracture, left, closed, with routine healing, subsequent encounter      Darien Ramusavid W Meliss Fleek, MD 02/25/15 1030  Darien Ramusavid W Kihanna Kamiya, MD 02/25/15 1100

## 2015-02-26 LAB — BLOOD CULTURE ID PANEL (REFLEXED)
ACINETOBACTER BAUMANNII: NOT DETECTED
CANDIDA ALBICANS: NOT DETECTED
CANDIDA KRUSEI: NOT DETECTED
CANDIDA PARAPSILOSIS: NOT DETECTED
Candida glabrata: NOT DETECTED
Candida tropicalis: NOT DETECTED
Carbapenem resistance: NOT DETECTED
ENTEROCOCCUS SPECIES: NOT DETECTED
ESCHERICHIA COLI: NOT DETECTED
Enterobacter cloacae complex: NOT DETECTED
Enterobacteriaceae species: NOT DETECTED
Haemophilus influenzae: NOT DETECTED
KLEBSIELLA OXYTOCA: NOT DETECTED
Klebsiella pneumoniae: NOT DETECTED
LISTERIA MONOCYTOGENES: NOT DETECTED
Methicillin resistance: NOT DETECTED
Neisseria meningitidis: NOT DETECTED
Proteus species: NOT DETECTED
Pseudomonas aeruginosa: NOT DETECTED
SERRATIA MARCESCENS: NOT DETECTED
STREPTOCOCCUS AGALACTIAE: NOT DETECTED
STREPTOCOCCUS PYOGENES: NOT DETECTED
STREPTOCOCCUS SPECIES: NOT DETECTED
Staphylococcus aureus (BCID): DETECTED — AB
Staphylococcus species: DETECTED — AB
Streptococcus pneumoniae: NOT DETECTED
Vancomycin resistance: NOT DETECTED

## 2015-02-26 LAB — COMPREHENSIVE METABOLIC PANEL
ALBUMIN: 2.4 g/dL — AB (ref 3.5–5.0)
ALK PHOS: 71 U/L (ref 38–126)
ALT: 13 U/L — AB (ref 17–63)
AST: 20 U/L (ref 15–41)
BILIRUBIN TOTAL: 1.8 mg/dL — AB (ref 0.3–1.2)
BUN: 88 mg/dL — AB (ref 6–20)
CALCIUM: 8.1 mg/dL — AB (ref 8.9–10.3)
CO2: 22 mmol/L (ref 22–32)
Chloride: >130 mmol/L (ref 101–111)
Creatinine, Ser: 3.7 mg/dL — ABNORMAL HIGH (ref 0.61–1.24)
GFR calc Af Amer: 15 mL/min — ABNORMAL LOW (ref >60)
GFR calc non Af Amer: 13 mL/min — ABNORMAL LOW (ref >60)
GLUCOSE: 174 mg/dL — AB (ref 65–99)
Potassium: 4.4 mmol/L (ref 3.5–5.1)
SODIUM: 160 mmol/L — AB (ref 135–145)
Total Protein: 6 g/dL — ABNORMAL LOW (ref 6.5–8.1)

## 2015-02-26 LAB — CBC
HEMATOCRIT: 27.1 % — AB (ref 40.0–52.0)
HEMOGLOBIN: 8.4 g/dL — AB (ref 13.0–18.0)
MCH: 29.9 pg (ref 26.0–34.0)
MCHC: 31.2 g/dL — ABNORMAL LOW (ref 32.0–36.0)
MCV: 95.9 fL (ref 80.0–100.0)
Platelets: 246 10*3/uL (ref 150–440)
RBC: 2.83 MIL/uL — AB (ref 4.40–5.90)
RDW: 16.9 % — ABNORMAL HIGH (ref 11.5–14.5)
WBC: 18.9 10*3/uL — ABNORMAL HIGH (ref 3.8–10.6)

## 2015-02-26 LAB — PROTIME-INR
INR: 1.52
Prothrombin Time: 18.4 seconds — ABNORMAL HIGH (ref 11.4–15.0)

## 2015-02-26 MED ORDER — NAFCILLIN SODIUM 2 G IJ SOLR
2.0000 g | INTRAVENOUS | Status: DC
  Administered 2015-02-26 (×2): 2 g via INTRAVENOUS
  Filled 2015-02-26 (×4): qty 2000

## 2015-02-26 MED ORDER — LACTATED RINGERS IV BOLUS (SEPSIS): 1000.0000 mL | Freq: Once | INTRAVENOUS | Status: DC

## 2015-02-26 MED ORDER — VANCOMYCIN HCL IN DEXTROSE 750-5 MG/150ML-% IV SOLN: 750.0000 mg | Freq: Once | INTRAVENOUS | Status: DC

## 2015-02-26 MED ORDER — VANCOMYCIN HCL IN DEXTROSE 750-5 MG/150ML-% IV SOLN: 750.0000 mg | INTRAVENOUS | Status: DC

## 2015-02-26 MED ORDER — GLYCOPYRROLATE 0.2 MG/ML IJ SOLN
0.2000 mg | INTRAMUSCULAR | Status: DC
  Administered 2015-02-26: 11:00:00 0.2 mg via INTRAVENOUS
  Filled 2015-02-26: qty 1

## 2015-02-26 MED ORDER — DEXTROSE 5 % IV SOLN: 2.0000 g | Freq: Once | INTRAVENOUS | Status: DC

## 2015-02-26 MED ORDER — MORPHINE SULFATE (PF) 2 MG/ML IV SOLN
2.0000 mg | INTRAVENOUS | Status: DC | PRN
  Administered 2015-02-27 – 2015-02-28 (×6): 2 mg via INTRAVENOUS
  Filled 2015-02-26 (×6): qty 1

## 2015-02-26 MED ORDER — GLYCOPYRROLATE 0.2 MG/ML IJ SOLN
0.2000 mg | INTRAMUSCULAR | Status: DC | PRN
  Administered 2015-02-26: 0.2 mg via INTRAVENOUS
  Filled 2015-02-26: qty 1

## 2015-02-26 NOTE — Consult Note (Signed)
Patient is a 82 year old nonambulator with a tibia fracture being treated nonoperatively. His splint is intact at present  These to follow up as previously scheduled for casting

## 2015-02-26 NOTE — Plan of Care (Signed)
Problem: Education: Goal: Knowledge of [facility] Education information/materials will improve Outcome: Not Progressing Patient is comfort care. Morphine given with noted relief. Foley in place. Family updated.

## 2015-02-26 NOTE — Progress Notes (Addendum)
Reported to Dr Elisabeth PigeonVachhani verbally critical chloride >130

## 2015-02-26 NOTE — Progress Notes (Addendum)
Pharmacy Antibiotic Follow-up Note  Seth BreslowSherwood Phillips is a 82 y.o. year-old male admitted on 02/25/2015.  The patient is currently on day 1 of Zosyn for r/o sepsis.  Assessment/Plan: Lab called with [employer] ID, staphlococcus aureus without MEC A gene in anaerobic bottle only. Spoke with Dr. Anne Phillips, patient is still very ill and he wants to add vancomycin. He will enter consult for pharmacy to dose. Of note, currently acute on chronic renal disease.   Temp (24hrs), Avg:100 F (37.8 C), Min:98.3 F (36.8 C), Max:101.3 F (38.5 C)   Recent Labs Lab 02/25/15 0853  WBC 17.6*    Recent Labs Lab 02/25/15 0853 02/25/15 1019  CREATININE 3.35* 3.37*   Estimated Creatinine Clearance: 11.3 mL/min (by C-G formula based on Cr of 3.37).    Allergies  Allergen Reactions  . Codeine   . Sulfa Antibiotics   . Vancomycin   . Vicodin [Hydrocodone-Acetaminophen]    Thank you for allowing pharmacy to be a part of this patient's care.  Seth FrostNathan A Catrena Phillips, Pharm.D., BCPS Clinical Pharmacist 02/26/2015 1:22 AM  0201 vancomycin allergy listed, no reaction indicated. Spoke with nurse who said patient unable to clarify at this time. Spoke with Seth Phillips (facility) who didn't know reaction. Called son but no answer (it is 1:30 AM...). Dr Seth Phillips okayed switch from vancomycin to nafcillin. Zosyn may cover MSSA in blood but patient is ill-appearing and additional agent warranted. Starting nafcillin.

## 2015-02-26 NOTE — Progress Notes (Signed)
Called for blood culture positive for Staph Aureus, without MEC A gene, in aerobic bottle only.  Pt is very sick, with multiple lab abnormalities, leukocytosis, and recent fevers.  Will cover for now with nafcillin in addition to zosyn he is already on.  Might consider echocardiogram as part of endocarditis workup.  Kristeen MissWILLIS, Almer [place] FIELDING [facility] 02/26/2015, 2:05 AM

## 2015-02-26 NOTE — Progress Notes (Signed)
   02/26/15 1000  Clinical Encounter Type  Visited With Patient;Health care provider  Visit Type Initial;Spiritual support;Patient actively dying  Referral From Nurse  Consult/Referral To Chaplain  Spiritual Encounters  Spiritual Needs Prayer;Emotional;Ritual  Stress Factors  Patient Stress Factors Exhausted;Health changes;Major life changes;Loss of control  Met w/patient & Hospice care provider. Provided extreme unction and comfortable presence.   [place] Rollyson G. [place]

## 2015-02-26 NOTE — Progress Notes (Signed)
[facility] - [place] at [facility]   PATIENT NAME: Seth Phillips    MR#:  [id number]  DATE OF BIRTH:  16-Feb-1920  SUBJECTIVE:  CHIEF COMPLAINT:   Chief Complaint  Patient presents with  . Fever   Sent with sepsis, found having facial cellulitis, renal failure, Hematuria, staph bacteremia.   He is drowsy, barely responsive , non communicative.  REVIEW OF SYSTEMS:  Not able to provide ROS due to mental status change.  ROS  DRUG ALLERGIES:   Allergies  Allergen Reactions  . Codeine   . Sulfa Antibiotics   . Vancomycin   . Vicodin [Hydrocodone-Acetaminophen]     VITALS:  Blood pressure 100/81, pulse 112, temperature 98.6 F (37 C), temperature source Oral, resp. rate 16, height 5\' 9"  (1.753 m), weight 59.013 kg (130 lb 1.6 oz), SpO2 100 %.  PHYSICAL EXAMINATION:  GENERAL:  82 y.o.-year-old patient lying in the bed acute distress.  EYES: Pupils equal, round, reactive to light. No scleral icterus. HEENT: Head atraumatic, normocephalic. Oropharynx dry. Face swollen.  NECK:  Supple, no jugular venous distention. No thyroid enlargement, no tenderness.  LUNGS: Rapid short , shallow breaths, wheezing and coarse breath sounds b/l. CARDIOVASCULAR: S1, S2 normal. No murmurs.  ABDOMEN: Soft, nontender, nondistended. Bowel sounds present. No organomegaly or mass. Foley catheter in place with dark red urine. EXTREMITIES: No pedal edema, cyanosis, or clubbing. Cast on left leg present. NEUROLOGIC: Pt is drowsy, opens eyes, but does not follow commands, just have some moaning. Moves upper limbs a little to stimuli. PSYCHIATRIC: drowsy as above.    Physical Exam LABORATORY PANEL:   CBC  Recent Labs Lab 02/26/15 0538  WBC 18.9*  HGB 8.4*  HCT 27.1*  PLT 246   ------------------------------------------------------------------------------------------------------------------  Chemistries   Recent Labs Lab 02/25/15 1019  NA 159*  K 3.8  CL  129*  CO2 21*  GLUCOSE 185*  BUN 78*  CREATININE 3.37*  CALCIUM 8.0*  AST 15  ALT 9*  ALKPHOS 86  BILITOT 1.4*   ------------------------------------------------------------------------------------------------------------------  Cardiac Enzymes No results for input(s): TROPONINI in the last 168 hours. ------------------------------------------------------------------------------------------------------------------  RADIOLOGY:  Dg Chest Port 1 View  02/25/2015  CLINICAL DATA:  Bilateral facial swelling and altered mental status. Baseline dementia. Additional history of hypertension. EXAM: PORTABLE CHEST 1 VIEW COMPARISON:  Chest x-ray dated 04/10/2014. FINDINGS: Study is hypoinspiratory with crowding of the perihilar and basilar bronchovascular markings. Given the low lung volumes, lungs appear clear. No evidence of pneumonia seen. No pleural effusion seen. Mild cardiomegaly, likely accentuated by the low lung volumes. Overall cardiomediastinal silhouette appears stable in size and configuration. Atherosclerotic changes again seen along the walls of the ectatic thoracic aorta. Osteopenia limits characterization of osseous detail. No acute-appearing osseous abnormality seen. IMPRESSION: Hypoinspiratory exam. No evidence of acute cardiopulmonary abnormality. Electronically Signed   By: Bary Richard M.D.   On: 02/25/2015 09:31    ASSESSMENT AND PLAN:   Principal Problem:   Sepsis (HCC) Active Problems:   Parotitis, acute   ARF (acute renal failure) (HCC)   Dehydration   * Sepsis- due to staph bacteremia and facial cellulitis   Given vanc , zosyn, and nafcillin.   Prognosis extremely poor with renal failure and hematuria.   I spoke to son on phone.    Pt was followed by hospice in nursing home.   He want him to be comfortable and go to hospice home , if possible.  * Ac renal failure, hematuria   Given  IV fluids.   Comfort care now.  * Left leg fracture   S/p cast.  * Acute  encephalopathy- metabolic due to sepsis.   Comfort care.  All the records are reviewed and case discussed with Care Management/Social Workerr. Management plans discussed with the patient, family and they are in agreement.  CODE STATUS: DNR  TOTAL TIME TAKING CARE OF THIS PATIENT: 80 minutes.   POSSIBLE D/C IN *1-2 DAYS, DEPENDING ON CLINICAL CONDITION.   Altamese DillingVACHHANI, Nitesh Pitstick M.D on 02/26/2015   Between 7am to 6pm - Pager - [phone]  After 6pm go to [email or web address] - password [email or web address]  [facility] [place] [facility]  Office  [phone]  CC: Primary care physician; Lauro RegulusANDERSON,[name], MD  Note: This dictation was prepared with Dragon dictation along with smaller phrase technology. Any transcriptional errors that result from this process are unintentional.

## 2015-02-26 NOTE — Progress Notes (Signed)
Clinical Social Worker (CSW) received consult from MD this morning that patient is comfort care and appropriate for hospice home. CSW met with patient's son Herbie Baltimore who is agreeable to patient transferring to hospice home and signed consent forms.   Per MD and RN patient is not stable for transport now and will stay at [facility]. CSW updated Careers information officer at [facility]. CSW will continue to follow and assist as needed.   Blima Rich, [place] [phone]

## 2015-02-26 NOTE — Progress Notes (Signed)
Nutrition Brief Note  Pt triggered for assessment due to MST score; dietitian consult received but discontinued by MD.  Chart reviewed. Pt now transitioning to comfort care.  No further nutrition interventions warranted at this time.  Please re-consult as needed.   Romelle Starcherate Syed Zukas MS, RD, LDN [phone] Pager  [phone] Weekend/On-Call Pager

## 2015-02-26 NOTE — Progress Notes (Signed)
Dr Elisabeth PigeonVachhani- discontinue fluids, robinul 0.2mg  IV every 4 hours

## 2015-02-26 NOTE — Progress Notes (Signed)
Paged and spoke with Dr. Anne HahnWIllis about pt pink tinged urine in foley. Per Dr. Anne HahnWillis, continue to monitor and if any changes, page MD. Dr. Anne HahnWillis is aware that pt looks as if he dying.

## 2015-02-26 NOTE — Progress Notes (Signed)
Spoke to Dr pyreddy. Urine now bright red blood, pt seems to be dying , non verbal , does not seem to be in distress, md wanted to order urology consult, CBI, PT/INR, and discontinue all blood thinners.   Per nursing supervisor nursing cannot iniate CBI will need to see urology first.

## 2015-02-26 NOTE — Progress Notes (Signed)
   02/26/15 0945  PT Visit Information  Last PT Received On 02/26/15  Reason Eval/Treat Not Completed Medical issues which prohibited therapy (patient on comfort care; RN said he is not appropriate at this time)

## 2015-02-27 DIAGNOSIS — N179 Acute kidney failure, unspecified: Secondary | ICD-10-CM

## 2015-02-27 DIAGNOSIS — Z515 Encounter for palliative care: Secondary | ICD-10-CM

## 2015-02-27 DIAGNOSIS — K1121 Acute sialoadenitis: Secondary | ICD-10-CM

## 2015-02-27 DIAGNOSIS — Z66 Do not resuscitate: Secondary | ICD-10-CM

## 2015-02-27 DIAGNOSIS — I10 Essential (primary) hypertension: Secondary | ICD-10-CM

## 2015-02-27 DIAGNOSIS — F0390 Unspecified dementia without behavioral disturbance: Secondary | ICD-10-CM

## 2015-02-27 DIAGNOSIS — G4730 Sleep apnea, unspecified: Secondary | ICD-10-CM

## 2015-02-27 LAB — COMPREHENSIVE METABOLIC PANEL
ALT: 11 U/L — ABNORMAL LOW (ref 17–63)
AST: 16 U/L (ref 15–41)
Albumin: 3.1 g/dL — ABNORMAL LOW (ref 3.5–5.0)
Alkaline Phosphatase: 94 U/L (ref 38–126)
BUN: 82 mg/dL — AB (ref 6–20)
CO2: 22 mmol/L (ref 22–32)
CREATININE: 3.35 mg/dL — AB (ref 0.61–1.24)
Calcium: 8.9 mg/dL (ref 8.9–10.3)
GFR, EST AFRICAN AMERICAN: 17 mL/min — AB (ref >60)
GFR, EST NON AFRICAN AMERICAN: 15 mL/min — AB (ref >60)
Glucose, Bld: 176 mg/dL — ABNORMAL HIGH (ref 65–99)
POTASSIUM: 4.3 mmol/L (ref 3.5–5.1)
SODIUM: 163 mmol/L — AB (ref 135–145)
Total Bilirubin: 1.9 mg/dL — ABNORMAL HIGH (ref 0.3–1.2)
Total Protein: 7.4 g/dL (ref 6.5–8.1)

## 2015-02-27 MED ORDER — MORPHINE SULFATE (CONCENTRATE) 10 MG/0.5ML PO SOLN: 5.0000 mg | ORAL | Status: AC | PRN

## 2015-02-27 MED ORDER — PROCHLORPERAZINE 25 MG RE SUPP: 25.0000 mg | Freq: Three times a day (TID) | RECTAL | Status: AC | PRN

## 2015-02-27 MED ORDER — BISACODYL 10 MG RE SUPP: 10.0000 mg | Freq: Every day | RECTAL | Status: AC | PRN

## 2015-02-27 MED ORDER — PROCHLORPERAZINE 25 MG RE SUPP
25.0000 mg | Freq: Three times a day (TID) | RECTAL | Status: DC | PRN
  Filled 2015-02-27: qty 1

## 2015-02-27 MED ORDER — BISACODYL 10 MG RE SUPP: 10.0000 mg | Freq: Every day | RECTAL | Status: DC | PRN

## 2015-02-27 MED ORDER — LORAZEPAM 2 MG/ML IJ SOLN: 0.5000 mg | INTRAMUSCULAR | Status: DC | PRN

## 2015-02-27 MED ORDER — GLYCOPYRROLATE 1 MG PO TABS: 1.0000 mg | ORAL_TABLET | Freq: Three times a day (TID) | ORAL | Status: AC | PRN

## 2015-02-27 MED ORDER — ACETAMINOPHEN 650 MG RE SUPP: 650.0000 mg | RECTAL | Status: AC | PRN

## 2015-02-27 MED ORDER — MORPHINE SULFATE (CONCENTRATE) 10 MG/0.5ML PO SOLN
5.0000 mg | ORAL | Status: DC | PRN
  Administered 2015-02-27 – 2015-02-28 (×2): 5 mg via ORAL
  Filled 2015-02-27 (×2): qty 1

## 2015-02-27 MED ORDER — LORAZEPAM 0.5 MG PO TABS: 0.5000 mg | ORAL_TABLET | ORAL | Status: AC | PRN

## 2015-02-27 MED ORDER — LORAZEPAM 2 MG/ML IJ SOLN
0.5000 mg | Freq: Once | INTRAMUSCULAR | Status: AC
  Administered 2015-02-27: 0.5 mg via INTRAVENOUS
  Filled 2015-02-27: qty 1

## 2015-02-27 MED ORDER — LORAZEPAM 0.5 MG PO TABS: 0.5000 mg | ORAL_TABLET | ORAL | Status: DC | PRN

## 2015-02-27 NOTE — Plan of Care (Signed)
Problem: Education: Goal: Knowledge of [facility] Education information/materials will improve Outcome: Not Progressing Patient is comfort care. Morphine given with noted relief. Foley in place. Continues on 3L oxygen

## 2015-02-27 NOTE — Progress Notes (Addendum)
Visit made. Patient is currently followed by [facility] with a hospice diagnosis of Alzheimer's disease. He is a DNR. Patient seen lying in bed eyes open, unfocused gaze. Mouth very dry, mouth care provided. Daughter Diane present during visit, emotional support offered. Patient was felt to be too unstable for transport to the hospice home yesterday, he continues to require IV morphine for pain control. Will contact patient's son Molly MaduroRobert regarding transfer to the hospice home tomorrow if patient remains stable. BP 104/56, heart rate 113 at 11:00 am this morning. Update given to hospital care team.  Dayna BarkerKaren Robertson RN, BSN, [facility] [place], [facility] Liaison [phone] c

## 2015-02-27 NOTE — Progress Notes (Signed)
[facility] - [place] [facility]   PATIENT NAME: Seth Phillips    MR#:  [id number]  DATE OF BIRTH:  01/08/1920  SUBJECTIVE:  CHIEF COMPLAINT:   Chief Complaint  Patient presents with  . Fever   Sent with sepsis, found having facial cellulitis, renal failure, Hematuria, staph bacteremia.   He is drowsy, barely responsive , non communicative.  On comfort care now- not arousable, seen i morning - then he looked in little respi distressed.  REVIEW OF SYSTEMS:  Not able to provide ROS due to mental status change.  ROS  DRUG ALLERGIES:   Allergies  Allergen Reactions  . Codeine   . Sulfa Antibiotics   . Vancomycin   . Vicodin [Hydrocodone-Acetaminophen]     VITALS:  Blood pressure 104/56, pulse 113, temperature 99.5 F (37.5 C), temperature source Oral, resp. rate 20, height 5\' 9"  (1.753 m), weight 59.013 kg (130 lb 1.6 oz), SpO2 100 %.  PHYSICAL EXAMINATION:  GENERAL:  82 y.o.-year-old patient lying in the bed no acute distress.  EYES: Pupils equal, round, reactive to light. No scleral icterus. HEENT: Head atraumatic, normocephalic. Oropharynx dry. Face swollen.  NECK:  Supple, no jugular venous distention. No thyroid enlargement, no tenderness.  LUNGS: Rapid short , shallow breaths, wheezing and coarse breath sounds b/l.on supplemental oxygen. CARDIOVASCULAR: S1, S2 normal. No murmurs.  ABDOMEN: Soft, nontender, nondistended. Bowel sounds present. No organomegaly or mass. Foley catheter in place with dark red urine. EXTREMITIES: No pedal edema, cyanosis, or clubbing. Cast on left leg present. NEUROLOGIC: Pt is drowsy, opens eyes, but does not follow commands, just have some moaning. Moves upper limbs a little to stimuli. PSYCHIATRIC: drowsy as above.    Physical Exam LABORATORY PANEL:   CBC  Recent Labs Lab 02/26/15 0538  WBC 18.9*  HGB 8.4*  HCT 27.1*  PLT 246    ------------------------------------------------------------------------------------------------------------------  Chemistries   Recent Labs Lab 02/26/15 0538  NA 160*  K 4.4  CL >130*  CO2 22  GLUCOSE 174*  BUN 88*  CREATININE 3.70*  CALCIUM 8.1*  AST 20  ALT 13*  ALKPHOS 71  BILITOT 1.8*   ------------------------------------------------------------------------------------------------------------------  Cardiac Enzymes No results for input(s): TROPONINI in the last 168 hours. ------------------------------------------------------------------------------------------------------------------  RADIOLOGY:  No results found.  ASSESSMENT AND PLAN:   Principal Problem:   Sepsis (HCC) Active Problems:   Parotitis, acute   ARF (acute renal failure) (HCC)   Dehydration   * Sepsis- due to staph bacteremia and facial cellulitis   Given vanc , zosyn, and nafcillin.    On comfort care, will send to Hospice home, if stable.  * Ac renal failure, hematuria   Given IV fluids.   Comfort care now.  * Left leg fracture  * Acute encephalopathy- metabolic due to sepsis.  All the records are reviewed and case discussed with Care Management/Social Workerr. Management plans discussed with the patient, family and they are in agreement.  CODE STATUS: DNR  TOTAL TIME TAKING CARE OF THIS PATIENT: 25 minutes.  Spoke to hospice nurse about hospice home transfer.  POSSIBLE D/C IN *1-2 DAYS, DEPENDING ON CLINICAL CONDITION.   Seth Phillips, Seth Phillips M.D on 02/27/2015   Between 7am to 6pm - Pager - [phone]  After 6pm go to [email or web address] - password [email or web address]  Seth Phillips [place] [facility]  Office  [phone]  CC: Primary care physician; Seth RegulusANDERSON,[name], MD  Note: This dictation was prepared with Dragon dictation along with smaller phrase technology. Any transcriptional errors that  result from this process are unintentional.

## 2015-02-27 NOTE — Progress Notes (Signed)
Palliative Care Update  This is an active [facility]ospice [facility] pt and he is to go to [facility] when a bed is available. His BP was too low yesterday for him to be transported but it is 104/56 now and he can go when a bed opens up.  I have reviewed the record, talked with Hospice Liaison, and reviewed meds.  I have added a few symptom RXs and have completed the DC medication reconciliation list with focus on comfort meds.  Will see pt again and make sure his needs are fully met.  Full note to follow.  Colleen Can, MD

## 2015-02-27 NOTE — Care Management Important Message (Signed)
Important Message  Patient Details  Name: Wendelyn BreslowSherwood Salek MRN: [id number] Date of Birth: 04/28/1919   Medicare Important Message Given:  Yes    Gwenette GreetBrenda S Teighan Aubert, RN 02/27/2015, 10:42 AM

## 2015-02-28 LAB — CULTURE, BLOOD (ROUTINE X 2)

## 2015-02-28 NOTE — Progress Notes (Signed)
Follow up visit made. Patient appears stable for transport via EMS to the Hospice Home. Writer contacted son [name] MaltaRichard Harwood who is in agreement. Hospital care team notified. Report called to hospice home. EMS notified for transport. Signed portable DNR in place in discharge packet, discharge summary faxed to referral intake. Dayna BarkerKaren Robertson RN, BSN, [facility] [place], [facility] Liaison [phone] c

## 2015-02-28 NOTE — Plan of Care (Signed)
Problem: Education: Goal: Knowledge of [place] [facility] Education information/materials will improve Outcome: Progressing Remains unresponse. Morphine given x1 w/ noted relief. Foley in place for end of life care. 3L oxygen in place for comfort. Plan to d/c to Hospice Home today.

## 2015-02-28 NOTE — Clinical Social Work Note (Signed)
[facility]. CSW is signing off as no further needs identified.   Dede QuerySarah Kalis Friese, MSW, LCSW Clinical Social Worker  [phone]

## 2015-02-28 NOTE — Discharge Summary (Signed)
[facility] - [place] at [facility]   PATIENT NAME: Seth Phillips    MR#:  [id number]  DATE OF BIRTH:  1919-05-04  DATE OF ADMISSION:  02/25/2015 ADMITTING PHYSICIAN: Adrian Saran, MD  DATE OF DISCHARGE: 02/28/2015  PRIMARY CARE PHYSICIAN: Lauro Regulus., MD    ADMISSION DIAGNOSIS:  Hypernatremia [E87.0] Tibia/fibula fracture, left, closed, with routine healing, subsequent encounter [S82.92XD] Sepsis, due to unspecified organism (HCC) [A41.9] Acute renal failure, unspecified acute renal failure type (HCC) [N17.9]  DISCHARGE DIAGNOSIS:  Principal Problem:   Sepsis (HCC) Active Problems:   Parotitis, acute   ARF (acute renal failure) (HCC)   Dehydration   SECONDARY DIAGNOSIS:   Past Medical History  Diagnosis Date  . Dementia   . Hypertension   . OSA (obstructive sleep apnea)     HOSPITAL COURSE:   * Sepsis- due to staph bacteremia and facial cellulitis  Given vanc , zosyn, and nafcillin.  On comfort care, will send to Hospice home, when bed available.  * Ac renal failure, hematuria  Given IV fluids.  Comfort care now.   On foley.  * Left leg fracture  * Acute encephalopathy- metabolic due to sepsis.  DISCHARGE CONDITIONS:  Fair.   CONSULTS OBTAINED:  Treatment Team:  Lamont Dowdy, MD Kennedy Bucker, MD  DRUG ALLERGIES:   Allergies  Allergen Reactions  . Codeine   . Sulfa Antibiotics   . Vancomycin   . Vicodin [Hydrocodone-Acetaminophen]     DISCHARGE MEDICATIONS:   Current Discharge Medication List    START taking these medications   Details  acetaminophen (TYLENOL) 650 MG suppository Place 1 suppository (650 mg total) rectally every 4 (four) hours as needed for mild pain (fever >/= 101). Qty: 12 suppository, Refills: 0    bisacodyl (DULCOLAX) 10 MG suppository Place 1 suppository (10 mg total) rectally daily as needed for moderate constipation. Qty: 6 suppository, Refills: 0    glycopyrrolate  (ROBINUL) 1 MG tablet Take 1 tablet (1 mg total) by mouth 3 (three) times daily as needed. Qty: 15 tablet, Refills: 0    LORazepam (ATIVAN) 0.5 MG tablet Take 1 tablet (0.5 mg total) by mouth every 4 (four) hours as needed for anxiety. Qty: 30 tablet, Refills: 0    Morphine Sulfate (MORPHINE CONCENTRATE) 10 MG/0.5ML SOLN concentrated solution Take 0.25 mLs (5 mg total) by mouth every 2 (two) hours as needed for moderate pain, severe pain or shortness of breath. Qty: 30 mL, Refills: 0    prochlorperazine (COMPAZINE) 25 MG suppository Place 1 suppository (25 mg total) rectally every 8 (eight) hours as needed for nausea or vomiting. Qty: 6 suppository, Refills: 0      STOP taking these medications     aspirin 81 MG chewable tablet      fentaNYL (DURAGESIC - DOSED MCG/HR) 25 MCG/HR patch      lisinopril (PRINIVIL,ZESTRIL) 10 MG tablet      traMADol (ULTRAM) 50 MG tablet      Vitamin D, Ergocalciferol, (DRISDOL) 50000 units CAPS capsule          DISCHARGE INSTRUCTIONS:    Comfort care.  If you experience worsening of your admission symptoms, develop shortness of breath, life threatening emergency, suicidal or homicidal thoughts you must seek medical attention immediately by calling 911 or calling your MD immediately  if symptoms less severe.  You Must read complete instructions/literature along with all the possible adverse reactions/side effects for all the Medicines you take and that have been prescribed to you.  Take any new Medicines after you have completely understood and accept all the possible adverse reactions/side effects.   Please note  You were cared for by a hospitalist during your hospital stay. If you have any questions about your discharge medications or the care you received while you were in the hospital after you are discharged, you can call the unit and asked to speak with the hospitalist on call if the hospitalist that took care of you is not available. Once you  are discharged, your primary care physician will handle any further medical issues. Please note that NO REFILLS for any discharge medications will be authorized once you are discharged, as it is imperative that you return to your primary care physician (or establish a relationship with a primary care physician if you do not have one) for your aftercare needs so that they can reassess your need for medications and monitor your lab values.    Today   CHIEF COMPLAINT:   Chief Complaint  Patient presents with  . Fever    HISTORY OF PRESENT ILLNESS:  Seth Phillips  is a 82 y.o. male with a known history of dementia and HTN who is non-verbal at baseline sent to the ER from SNF with fever and facial swelling. Was in ER last week with LE fracture. Now, pt has severe bilateral facial swelling and is febrile and tachycardic. WBC elevated. Labs show dehydration with acute renal failure. The patient is non-verbal and unable to provide history or ROS. He is now admitted for further evaluation.   VITAL SIGNS:  Blood pressure 88/52, pulse 94, temperature 97.7 F (36.5 C), temperature source Oral, resp. rate 18, height 5\' 9"  (1.753 m), weight 59.013 kg (130 lb 1.6 oz), SpO2 97 %.  I/O:   Intake/Output Summary (Last 24 hours) at 02/28/15 1145 Last data filed at 02/28/15 0357  Gross per 24 hour  Intake      0 ml  Output    100 ml  Net   -100 ml    PHYSICAL EXAMINATION:   GENERAL: 82 y.o.-year-old patient lying in the bed no acute distress.  EYES: Pupils equal, round, reactive to light. No scleral icterus. HEENT: Head atraumatic, normocephalic. Oropharynx dry. Face swollen.  NECK: Supple, no jugular venous distention. No thyroid enlargement, no tenderness.  LUNGS: Rapid short , shallow breaths, wheezing and coarse breath sounds b/l.on supplemental oxygen. CARDIOVASCULAR: S1, S2 normal. No murmurs.  ABDOMEN: Soft, nontender, nondistended. Bowel sounds present. No organomegaly or mass.  Foley catheter in place with dark red urine. EXTREMITIES: No pedal edema, cyanosis, or clubbing. Cast on left leg present. NEUROLOGIC: Pt is drowsy, opens eyes, but does not follow commands, just have some moaning. Moves upper limbs a little to stimuli. PSYCHIATRIC: drowsy as above.   DATA REVIEW:   CBC  Recent Labs Lab 02/26/15 0538  WBC 18.9*  HGB 8.4*  HCT 27.1*  PLT 246    Chemistries   Recent Labs Lab 02/26/15 0538  NA 160*  K 4.4  CL >130*  CO2 22  GLUCOSE 174*  BUN 88*  CREATININE 3.70*  CALCIUM 8.1*  AST 20  ALT 13*  ALKPHOS 71  BILITOT 1.8*    Cardiac Enzymes No results for input(s): TROPONINI in the last 168 hours.  Microbiology Results  Results for orders placed or performed during the hospital encounter of 02/25/15  Blood Culture (routine x 2)     Status: None   Collection Time: 02/25/15  8:53 AM  Result Value  Ref Range Status   Specimen Description BLOOD LEFT HAND  Final   Special Requests   Final    BOTTLES DRAWN AEROBIC AND ANAEROBIC  AER 6CC ANA2CC   Culture  Setup Time   Final    GRAM POSITIVE COCCI IN BOTH AEROBIC AND ANAEROBIC BOTTLES CRITICAL RESULT CALLED TO, READ BACK BY AND VERIFIED WITH: NATE COOKSON AT 0116 02/26/15.PMH CONFIRMED BY RWW    Culture   Final    STAPHYLOCOCCUS AUREUS IN BOTH AEROBIC AND ANAEROBIC BOTTLES    Report Status 02/28/2015 FINAL  Final   Organism ID, Bacteria STAPHYLOCOCCUS AUREUS  Final      Susceptibility   Staphylococcus aureus - MIC*    CIPROFLOXACIN 4 RESISTANT Resistant     ERYTHROMYCIN >=8 RESISTANT Resistant     GENTAMICIN <=0.5 SENSITIVE Sensitive     OXACILLIN 0.5 SENSITIVE Sensitive     TRIMETH/SULFA Value in next row Sensitive      SENSITIVE<=10    CLINDAMYCIN Value in next row Sensitive      SENSITIVE<=10    CEFOXITIN SCREEN Value in next row Sensitive      SENSITIVE<=10    Inducible Clindamycin Value in next row Sensitive      SENSITIVE<=10    TETRACYCLINE Value in next row Sensitive       SENSITIVE<=1    VANCOMYCIN Value in next row Sensitive      SENSITIVE1    RIFAMPIN Value in next row Sensitive      SENSITIVE<=0.5    * STAPHYLOCOCCUS AUREUS  Blood Culture (routine x 2)     Status: None (Preliminary result)   Collection Time: 02/25/15  8:53 AM  Result Value Ref Range Status   Specimen Description BLOOD RIGHT WRIST  Final   Special Requests BOTTLES DRAWN AEROBIC AND ANAEROBIC  3CC  Final   Culture  Setup Time   Final    GRAM POSITIVE COCCI AEROBIC BOTTLE ONLY CRITICAL VALUE NOTED.  VALUE IS CONSISTENT WITH PREVIOUSLY REPORTED AND CALLED VALUE. CONFIRMED BY RWW    Culture STAPHYLOCOCCUS AUREUS AEROBIC BOTTLE ONLY   Final   Report Status PENDING  Incomplete   Organism ID, Bacteria STAPHYLOCOCCUS AUREUS  Final      Susceptibility   Staphylococcus aureus - MIC*    CIPROFLOXACIN >=8 RESISTANT Resistant     ERYTHROMYCIN >=8 RESISTANT Resistant     GENTAMICIN <=0.5 SENSITIVE Sensitive     OXACILLIN 0.5 SENSITIVE Sensitive     TRIMETH/SULFA <=10 SENSITIVE Sensitive     CLINDAMYCIN <=0.25 SENSITIVE Sensitive     CEFOXITIN SCREEN NEGATIVE Sensitive     Inducible Clindamycin NEGATIVE Sensitive     TETRACYCLINE Value in next row Sensitive      SENSITIVE<=0.12    VANCOMYCIN Value in next row Sensitive      SENSITIVE1    RIFAMPIN Value in next row Sensitive      SENSITIVE<=0.5    LINEZOLID Value in next row Sensitive      SENSITIVE2    * STAPHYLOCOCCUS AUREUS  Urine culture     Status: None (Preliminary result)   Collection Time: 02/25/15  8:53 AM  Result Value Ref Range Status   Specimen Description URINE, RANDOM  Final   Special Requests NONE  Final   Culture HOLDING FOR POSSIBLE PATHOGEN  Final   Report Status PENDING  Incomplete  Blood Culture ID Panel (Reflexed)     Status: Abnormal   Collection Time: 02/25/15  8:53 AM  Result Value Ref Range Status  Enterococcus species NOT DETECTED NOT DETECTED Final   Listeria monocytogenes NOT DETECTED NOT  DETECTED Final   Staphylococcus species DETECTED (A) NOT DETECTED Final    Comment: CRITICAL RESULT CALLED TO, READ BACK BY AND VERIFIED WITH: NATE COOKSON AT 0116 02/26/15.PMH    Staphylococcus aureus DETECTED (A) NOT DETECTED Final    Comment: CRITICAL RESULT CALLED TO, READ BACK BY AND VERIFIED WITH: NATE COOKSON AT 0116 02/26/15.PMH    Streptococcus species NOT DETECTED NOT DETECTED Final   Streptococcus agalactiae NOT DETECTED NOT DETECTED Final   Streptococcus pneumoniae NOT DETECTED NOT DETECTED Final   Streptococcus pyogenes NOT DETECTED NOT DETECTED Final   Acinetobacter baumannii NOT DETECTED NOT DETECTED Final   Enterobacteriaceae species NOT DETECTED NOT DETECTED Final   Enterobacter cloacae complex NOT DETECTED NOT DETECTED Final   Escherichia coli NOT DETECTED NOT DETECTED Final   Klebsiella oxytoca NOT DETECTED NOT DETECTED Final   Klebsiella pneumoniae NOT DETECTED NOT DETECTED Final   Proteus species NOT DETECTED NOT DETECTED Final   Serratia marcescens NOT DETECTED NOT DETECTED Final   Haemophilus influenzae NOT DETECTED NOT DETECTED Final   Neisseria meningitidis NOT DETECTED NOT DETECTED Final   Pseudomonas aeruginosa NOT DETECTED NOT DETECTED Final   Candida albicans NOT DETECTED NOT DETECTED Final   Candida glabrata NOT DETECTED NOT DETECTED Final   Candida krusei NOT DETECTED NOT DETECTED Final   Candida parapsilosis NOT DETECTED NOT DETECTED Final   Candida tropicalis NOT DETECTED NOT DETECTED Final   Carbapenem resistance NOT DETECTED NOT DETECTED Final   Methicillin resistance NOT DETECTED NOT DETECTED Final   Vancomycin resistance NOT DETECTED NOT DETECTED Final  MRSA PCR Screening     Status: Abnormal   Collection Time: 02/25/15  3:16 PM  Result Value Ref Range Status   MRSA by PCR POSITIVE (A) NEGATIVE Final    Comment: CRITICAL RESULT CALLED TO, READ BACK BY AND VERIFIED WITH: MEGAN OAKLEY @ 1646 ON 02/25/2015 BY CAF        The GeneXpert MRSA Assay  (FDA approved for NASAL specimens only), is one component of a comprehensive MRSA colonization surveillance program. It is not intended to diagnose MRSA infection nor to guide or monitor treatment for MRSA infections.   Culture, routine-abscess     Status: None (Preliminary result)   Collection Time: 02/25/15  4:30 PM  Result Value Ref Range Status   Specimen Description ORAL  Final   Special Requests Normal  Final   Gram Stain   Final    RARE WBC SEEN MODERATE GRAM POSITIVE COCCI IN CLUSTERS MODERATE GRAM POSITIVE RODS FEW YEAST    Culture HEAVY GROWTH STAPHYLOCOCCUS AUREUS  Final   Report Status PENDING  Incomplete   Organism ID, Bacteria STAPHYLOCOCCUS AUREUS  Final      Susceptibility   Staphylococcus aureus - MIC*    CIPROFLOXACIN >=8 RESISTANT Resistant     ERYTHROMYCIN >=8 RESISTANT Resistant     GENTAMICIN <=0.5 SENSITIVE Sensitive     OXACILLIN 0.5 SENSITIVE Sensitive     TETRACYCLINE <=1 SENSITIVE Sensitive     TRIMETH/SULFA <=10 SENSITIVE Sensitive     CLINDAMYCIN <=0.25 SENSITIVE Sensitive     CEFOXITIN SCREEN NEGATIVE Sensitive     Inducible Clindamycin NEGATIVE Sensitive     LINEZOLID Value in next row Sensitive      SENSITIVE2    LEVOFLOXACIN Value in next row Intermediate      INTERMEDIATE4    * HEAVY GROWTH STAPHYLOCOCCUS AUREUS    RADIOLOGY:  No results found.     Management plans discussed with the patient, family and they are in agreement.  CODE STATUS:     Code Status Orders        Start     Ordered   02/25/15 1309  Do not attempt resuscitation (DNR)   Continuous    Question Answer Comment  In the event of cardiac or respiratory ARREST Do not call a "code blue"   In the event of cardiac or respiratory ARREST Do not perform Intubation, CPR, defibrillation or ACLS   In the event of cardiac or respiratory ARREST Use medication by any route, position, wound care, and other measures to relive pain and suffering. May use oxygen, suction and  manual treatment of airway obstruction as needed for comfort.      02/25/15 1309    Code Status History    Date Active Date Inactive Code Status Order ID Comments User Context   This patient has a current code status but no historical code status.    Advance Directive Documentation        Most Recent Value   Type of Advance Directive  Healthcare Power of Attorney, Out of facility DNR (pink MOST or yellow form)   Pre-existing out of facility DNR order (yellow form or pink MOST form)  Yellow form placed in chart (order not valid for inpatient use)   "MOST" Form in Place?        TOTAL TIME TAKING CARE OF THIS PATIENT: 35 minutes.    Altamese DillingVACHHANI, Marizol Borror M.D on 02/28/2015 at 11:45 AM  Between 7am to 6pm - Pager - [phone]  After 6pm go to [email or web address] - password [email or web address]  [facility] [place] [facility]  Office  [phone]  CC: Primary care physician; Lauro RegulusANDERSON,[name], MD   Note: This dictation was prepared with Dragon dictation along with smaller phrase technology. Any transcriptional errors that result from this process are unintentional.

## 2015-02-28 NOTE — Progress Notes (Signed)
Palliative Medicine Inpatient Consult Follow Up Note   Name: Seth Phillips Date: 02/28/2015 MRN: [id number]  DOB: [birth year]  Referring Physician: No att. providers found  Palliative Care consult requested for this 82 y.o. male for goals of medical therapy in patient with parotitis and renal failure due to dehydration.  TODAY'S DISCUSSIONS AND DECISIONS: Pt is stable for transfer to [facility]. BP 88/59 --but he is awake and moving limbs.  Currently, pt is contorted in bed and I heard him let out a moan. I have asked for some additional morphine to be given now.   He has meds ordered for discharge to [facility]. DC med rec is done. Attending is updated.    REVIEW OF SYSTEMS:  Patient is not able to provide ROS due to illness and dementia  CODE STATUS: DNR   PAST MEDICAL HISTORY: Past Medical History  Diagnosis Date  . Dementia   . Hypertension   . OSA (obstructive sleep apnea)     PAST SURGICAL HISTORY: History reviewed. No pertinent past surgical history.  Vital Signs: BP 88/52 mmHg  Pulse 94  Temp(Src) 97.7 F (36.5 C) (Oral)  Resp 18  Ht 5\' 9"  (1.753 m)  Wt 59.013 kg (130 lb 1.6 oz)  BMI 19.20 kg/m2  SpO2 97% Filed Weights   02/25/15 0845 02/25/15 1428 02/26/15 0501  Weight: 77.111 kg (170 lb) 58.514 kg (129 lb) 59.013 kg (130 lb 1.6 oz)    Estimated body mass index is 19.2 kg/(m^2) as calculated from the following:   Height as of this encounter: 5\' 9"  (1.753 m).   Weight as of this encounter: 59.013 kg (130 lb 1.6 oz).  PHYSICAL EXAM: Lying in bed awake but unresponsive.  Large swollen parotid glands bilaterally Eyes open --minimally No jVD or TM hrt rrr no m Lungs with ronchi no rales Abd soft and nt Ext no cyanosis or mottling   Pt moaned a bit while I was in the room and he was in a contorted position  LABS: CBC:    Component Value Date/Time   WBC 18.9* 02/26/2015 0538   WBC 9.9 01/03/2014 1524   HGB 8.4* 02/26/2015 0538   HGB  12.9* 01/03/2014 1524   HCT 27.1* 02/26/2015 0538   HCT 39.0* 01/03/2014 1524   PLT 246 02/26/2015 0538   PLT 234 01/03/2014 1524   MCV 95.9 02/26/2015 0538   MCV 97 01/03/2014 1524   NEUTROABS 16.6* 02/25/2015 0853   NEUTROABS 7.3* 09/25/2011 1004   LYMPHSABS 0.3* 02/25/2015 0853   LYMPHSABS 0.7* 09/25/2011 1004   MONOABS 0.7 02/25/2015 0853   MONOABS 0.4 09/25/2011 1004   EOSABS 0.0 02/25/2015 0853   EOSABS 0.0 09/25/2011 1004   BASOSABS 0.0 02/25/2015 0853   BASOSABS 0.0 09/25/2011 1004   Comprehensive Metabolic Panel:    Component Value Date/Time   NA 160* 02/26/2015 0538   NA 143 01/03/2014 1524   K 4.4 02/26/2015 0538   K 3.9 01/03/2014 1524   CL >130* 02/26/2015 0538   CL 106 01/03/2014 1524   CO2 22 02/26/2015 0538   CO2 30 01/03/2014 1524   BUN 88* 02/26/2015 0538   BUN 27* 01/03/2014 1524   CREATININE 3.70* 02/26/2015 0538   CREATININE 1.26 01/03/2014 1524   GLUCOSE 174* 02/26/2015 0538   GLUCOSE 112* 01/03/2014 1524   CALCIUM 8.1* 02/26/2015 0538   CALCIUM 8.6 01/03/2014 1524   AST 20 02/26/2015 0538   AST 23 01/03/2014 1524   ALT 13* 02/26/2015 4098  ALT 18 01/03/2014 1524   ALKPHOS 71 02/26/2015 0538   ALKPHOS 96 01/03/2014 1524   BILITOT 1.8* 02/26/2015 0538   BILITOT 2.0* 01/03/2014 1524   PROT 6.0* 02/26/2015 0538   PROT 6.9 01/03/2014 1524   ALBUMIN 2.4* 02/26/2015 0538   ALBUMIN 2.6* 01/03/2014 1524     More than 50% of the visit was spent in counseling/coordination of care: YES  Time Spent:  25 min

## 2015-02-28 NOTE — Consult Note (Signed)
Palliative Medicine Inpatient Consult Note   Name: Seth Phillips Date: 02/28/2015 MRN: [id number]  DOB: September 21, 1919  Referring Physician: No att. providers found  Palliative Care consult requested for this 82 y.o. male for goals of medical therapy in patient with acute parotitis, dementia, and acute renal failure.    TODAY'S DISCUSSIONS AND DECISIONS:  [facility] and [facility] is to go to [facility] when a bed is available. His BP was too low yesterday for him to be transported but it is 104/56 now and he can go when a bed opens up.  I have reviewed the record, talked with Hospice Liaison, and reviewed meds. I have added a few symptom RXs and have completed the DC medication reconciliation list with focus on comfort meds.  Will see pt again and make sure his needs are fully met.    REVIEW OF SYSTEMS:  Patient is not able to provide ROS  --due to lethargy  SPIRITUAL SUPPORT SYSTEM: Yes.  SOCIAL HISTORY:  reports that he has never smoked. He has never used smokeless tobacco. He reports that he does not drink alcohol or use illicit drugs.  LEGAL DOCUMENTS:  Portable DNR form  CODE STATUS: DNR  PAST MEDICAL HISTORY: Past Medical History  Diagnosis Date  . Dementia   . Hypertension   . OSA (obstructive sleep apnea)     PAST SURGICAL HISTORY: History reviewed. No pertinent past surgical history.  ALLERGIES:  is allergic to codeine; sulfa antibiotics; vancomycin; and vicodin.  MEDICATIONS:  Current Facility-Administered Medications  Medication Dose Route Frequency Provider Last Rate Last Dose  . acetaminophen (TYLENOL) suppository 650 mg  650 mg Rectal Q4H PRN Idelle Crouch, MD      . bisacodyl (DULCOLAX) suppository 10 mg  10 mg Rectal Daily PRN Colleen Can, MD      . glycopyrrolate (ROBINUL) injection 0.2 mg  0.2 mg Intravenous Q4H PRN Vaughan Basta, MD   0.2 mg at 02/26/15 2052  . LORazepam (ATIVAN) injection 0.5 mg  0.5 mg  Intravenous Q2H PRN Colleen Can, MD      . LORazepam (ATIVAN) tablet 0.5 mg  0.5 mg Oral Q4H PRN Colleen Can, MD      . morphine 2 MG/ML injection 2 mg  2 mg Intravenous Q2H PRN Lance Coon, MD   2 mg at 02/28/15 1134  . morphine CONCENTRATE 10 MG/0.5ML oral solution 5 mg  5 mg Oral Q2H PRN Colleen Can, MD   5 mg at 02/28/15 0015  . prochlorperazine (COMPAZINE) suppository 25 mg  25 mg Rectal Q8H PRN Colleen Can, MD       Current Outpatient Prescriptions  Medication Sig Dispense Refill  . acetaminophen (TYLENOL) 650 MG suppository Place 1 suppository (650 mg total) rectally every 4 (four) hours as needed for mild pain (fever >/= 101). 12 suppository 0  . bisacodyl (DULCOLAX) 10 MG suppository Place 1 suppository (10 mg total) rectally daily as needed for moderate constipation. 6 suppository 0  . glycopyrrolate (ROBINUL) 1 MG tablet Take 1 tablet (1 mg total) by mouth 3 (three) times daily as needed. 15 tablet 0  . LORazepam (ATIVAN) 0.5 MG tablet Take 1 tablet (0.5 mg total) by mouth every 4 (four) hours as needed for anxiety. 30 tablet 0  . Morphine Sulfate (MORPHINE CONCENTRATE) 10 MG/0.5ML SOLN concentrated solution Take 0.25 mLs (5 mg total) by mouth every 2 (two) hours as needed for moderate pain, severe pain  or shortness of breath. 30 mL 0  . prochlorperazine (COMPAZINE) 25 MG suppository Place 1 suppository (25 mg total) rectally every 8 (eight) hours as needed for nausea or vomiting. 6 suppository 0    Vital Signs: BP 88/52 mmHg  Pulse 94  Temp(Src) 97.7 F (36.5 C) (Oral)  Resp 18  Ht 5' 9" (1.753 m)  Wt 59.013 kg (130 lb 1.6 oz)  BMI 19.20 kg/m2  SpO2 97% Filed Weights   02/25/15 0845 02/25/15 1428 02/26/15 0501  Weight: 77.111 kg (170 lb) 58.514 kg (129 lb) 59.013 kg (130 lb 1.6 oz)    Estimated body mass index is 19.2 kg/(m^2) as calculated from the following:   Height as of this encounter: 5' 9" (1.753 m).   Weight as of this encounter:  59.013 kg (130 lb 1.6 oz).  PERFORMANCE STATUS (ECOG) : 4 - Bedbound  PHYSICAL EXAM: Lying in bed awake but unresponsive.  Large swollen parotid glands bilaterally Eyes open --minimally No jVD or TM hrt rrr no m Lungs with ronchi no rales Abd soft and nt Ext no cyanosis or mottling    LABS: CBC:    Component Value Date/Time   WBC 18.9* 02/26/2015 0538   WBC 9.9 01/03/2014 1524   HGB 8.4* 02/26/2015 0538   HGB 12.9* 01/03/2014 1524   HCT 27.1* 02/26/2015 0538   HCT 39.0* 01/03/2014 1524   PLT 246 02/26/2015 0538   PLT 234 01/03/2014 1524   MCV 95.9 02/26/2015 0538   MCV 97 01/03/2014 1524   NEUTROABS 16.6* 02/25/2015 0853   NEUTROABS 7.3* 09/25/2011 1004   LYMPHSABS 0.3* 02/25/2015 0853   LYMPHSABS 0.7* 09/25/2011 1004   MONOABS 0.7 02/25/2015 0853   MONOABS 0.4 09/25/2011 1004   EOSABS 0.0 02/25/2015 0853   EOSABS 0.0 09/25/2011 1004   BASOSABS 0.0 02/25/2015 0853   BASOSABS 0.0 09/25/2011 1004   Comprehensive Metabolic Panel:    Component Value Date/Time   NA 160* 02/26/2015 0538   NA 143 01/03/2014 1524   K 4.4 02/26/2015 0538   K 3.9 01/03/2014 1524   CL >130* 02/26/2015 0538   CL 106 01/03/2014 1524   CO2 22 02/26/2015 0538   CO2 30 01/03/2014 1524   BUN 88* 02/26/2015 0538   BUN 27* 01/03/2014 1524   CREATININE 3.70* 02/26/2015 0538   CREATININE 1.26 01/03/2014 1524   GLUCOSE 174* 02/26/2015 0538   GLUCOSE 112* 01/03/2014 1524   CALCIUM 8.1* 02/26/2015 0538   CALCIUM 8.6 01/03/2014 1524   AST 20 02/26/2015 0538   AST 23 01/03/2014 1524   ALT 13* 02/26/2015 0538   ALT 18 01/03/2014 1524   ALKPHOS 71 02/26/2015 0538   ALKPHOS 96 01/03/2014 1524   BILITOT 1.8* 02/26/2015 0538   BILITOT 2.0* 01/03/2014 1524   PROT 6.0* 02/26/2015 0538   PROT 6.9 01/03/2014 1524   ALBUMIN 2.4* 02/26/2015 0538   ALBUMIN 2.6* 01/03/2014 1524      More than 50% of the visit was spent in counseling/coordination of care: Yes  Time Spent:  35 minutes

## 2015-02-28 NOTE — Progress Notes (Signed)
Palliative Care Update   Pt is stable for transfer to [facility]osp[facility]. BP 88/59 --but he is awake and moving limbs.  Currently, pt is contorted in bed and I heard him let out a moan.  I have asked for some additional morphine to be given now.    He has meds ordered for discharge to [facility]. DC med rec is done.  Attending is updated.  Full note to follow.  Suan HalterMargaret F Sherman Donaldson, MD

## 2015-03-01 LAB — CULTURE, ROUTINE-ABSCESS: Special Requests: NORMAL

## 2015-03-01 LAB — URINE CULTURE

## 2015-03-02 LAB — CULTURE, BLOOD (ROUTINE X 2)

## 2015-03-22 DEATH — deceased

## 2016-12-22 IMAGING — CR DG CHEST 2V
1 series · 2 of 2 positions shown · non-contrast
Comparison: 01/03/2014

CLINICAL DATA: Cold symptoms, shortness of breath and weakness
since this morning, history hypertension, prostate cancer

EXAM:
CHEST  2 VIEW

[Series 1: dxr chest pa (or ap) and lateral · 0.14mm/px · 2 of 2 slices shown]
[im 1/2]
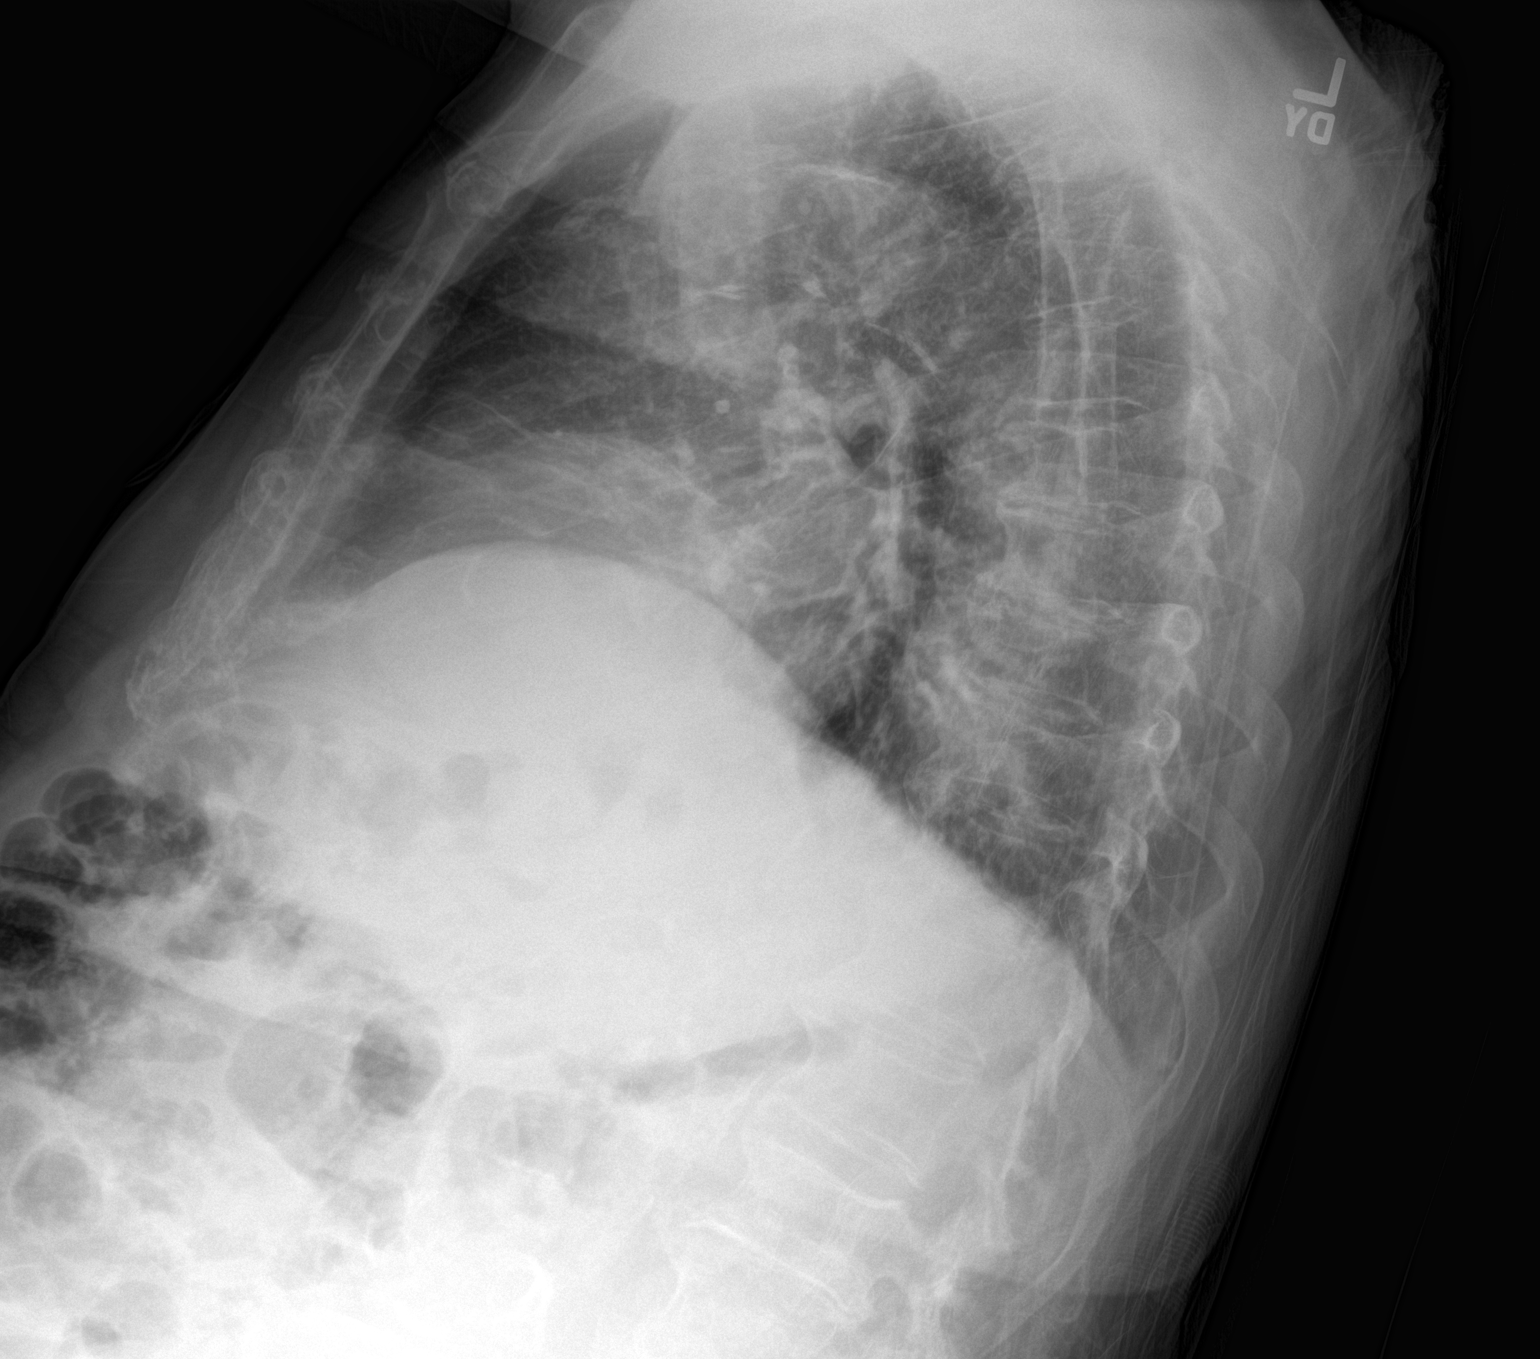
[im 2/2]
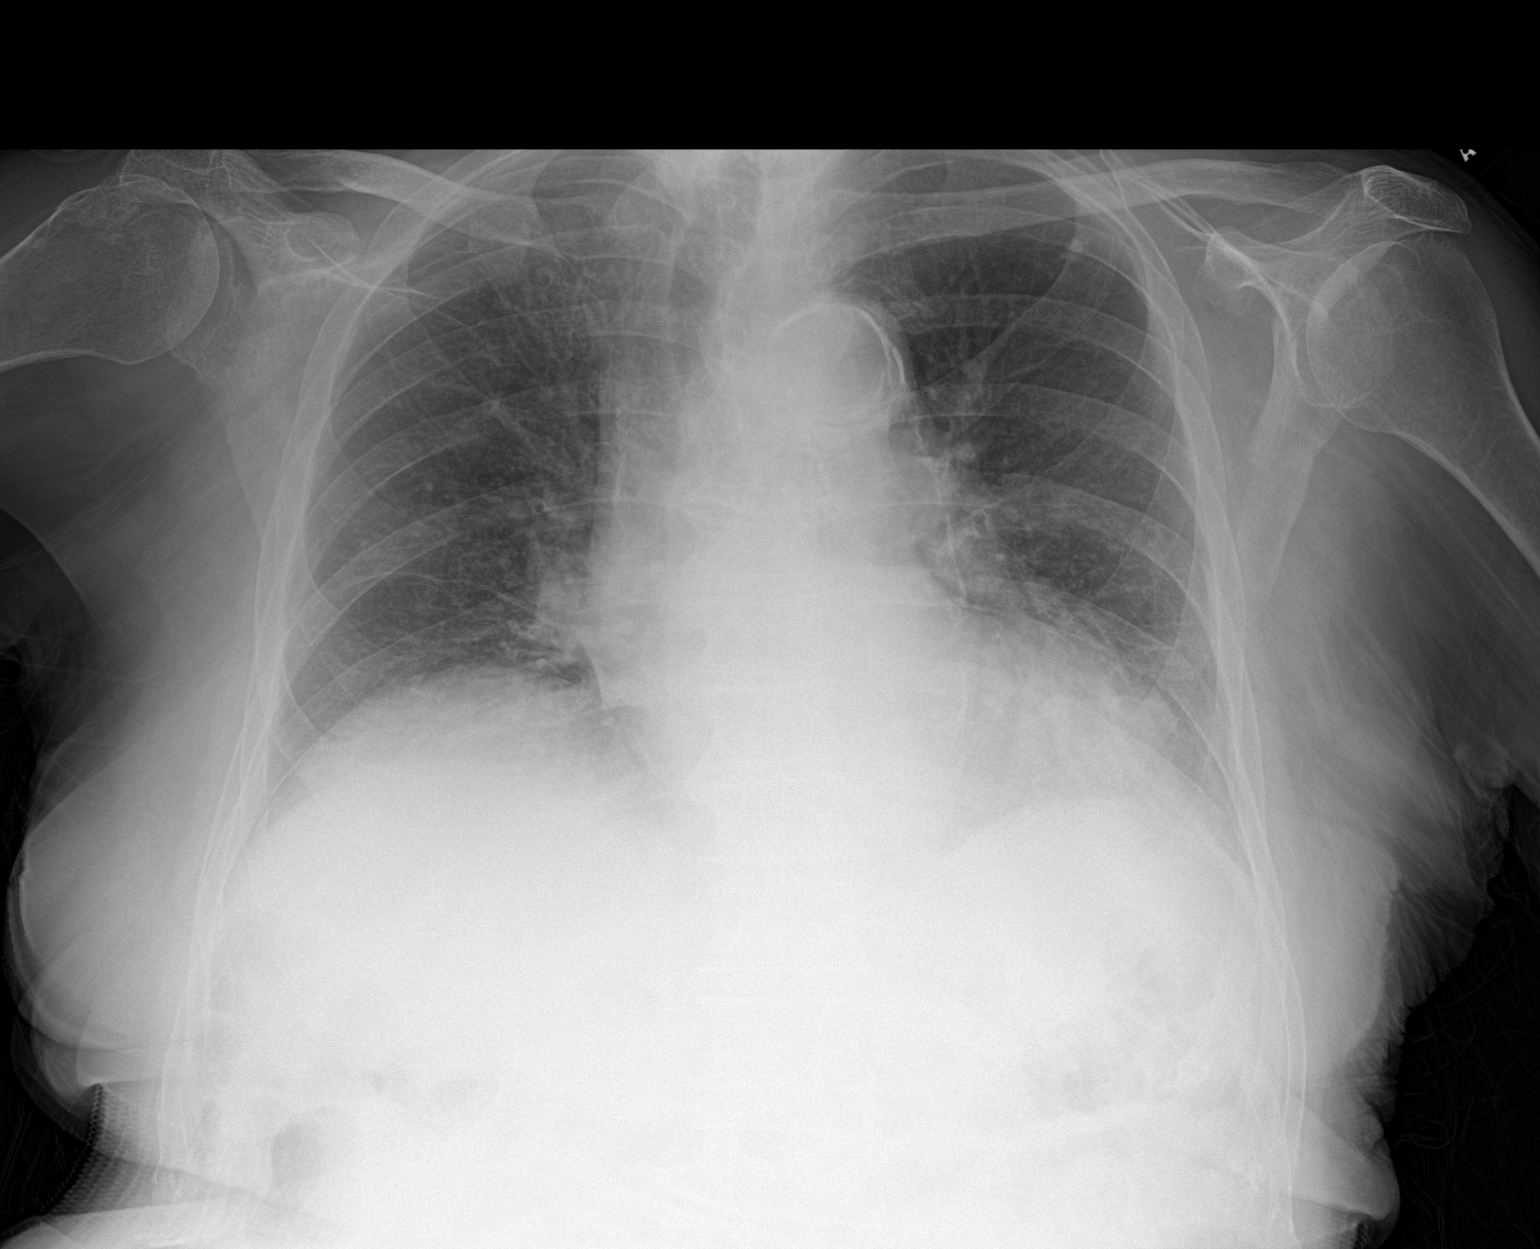

[2 of 2 positions shown; findings below may reference images not displayed]

FINDINGS: Enlargement of cardiac silhouette.

Calcified tortuous thoracic aorta.

Mediastinal contours normal.

Chronic RIGHT basilar atelectasis and decreased basilar lung
volumes.

Calcified granuloma LEFT upper lobe.

No acute infiltrate, pleural effusion or pneumothorax.

Mild central peribronchial thickening.

Bones demineralized.
IMPRESSION: Mild enlargement of cardiac silhouette.

Bronchitic changes with bibasilar volume loss and atelectasis.

## 2017-10-30 IMAGING — CR DG TIBIA/FIBULA 2V*L*
1 series · 4 of 4 positions shown · non-contrast
Comparison: Left ankle radiographs performed 01/03/2014

CLINICAL DATA: Heard pop at left lower leg with deformity and
bruising, when pivoting. Initial encounter.

EXAM:
LEFT TIBIA AND FIBULA - 2 VIEW

[Series 1: dg tibia/fibula left · 0.14mm/px · 4 of 4 slices shown]
[im 1/4]
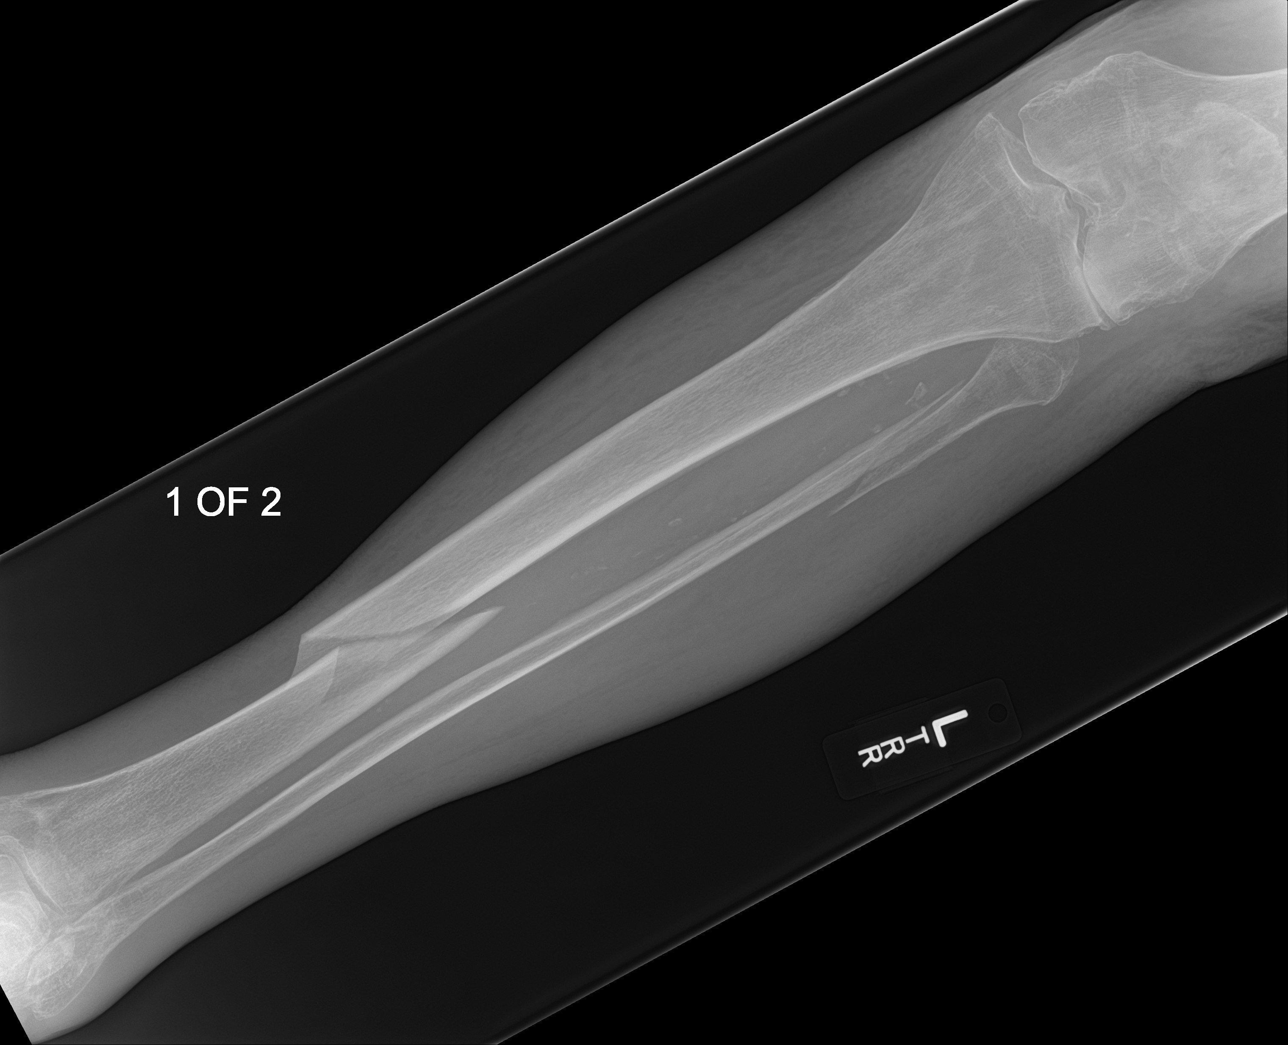
[im 2/4]
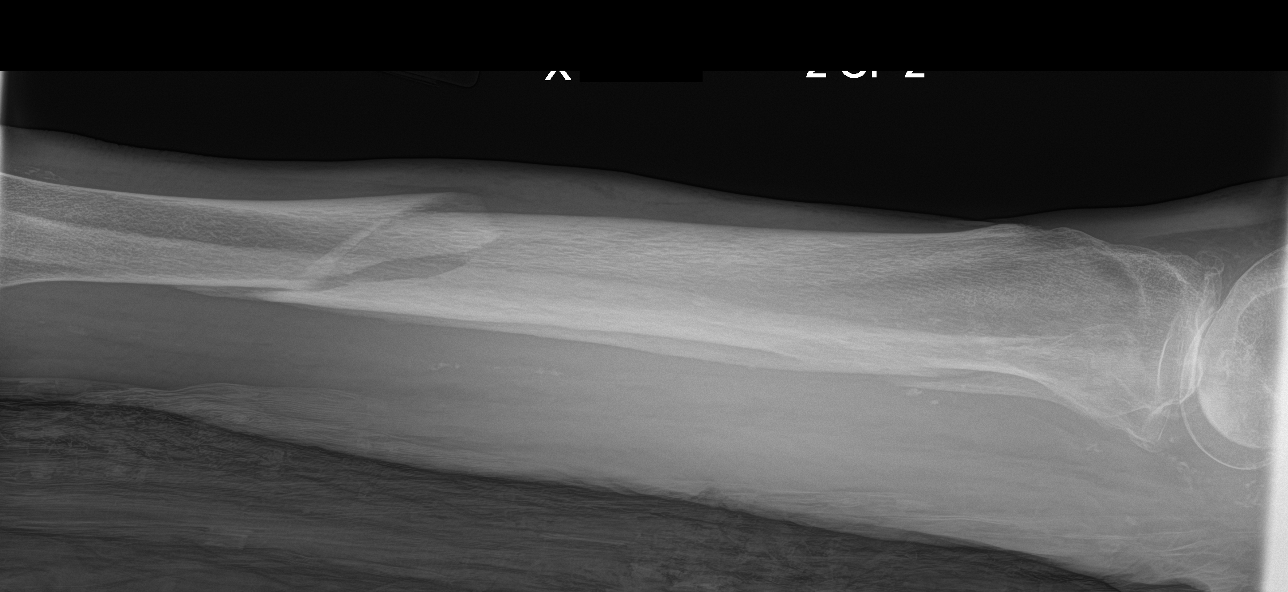
[im 3/4]
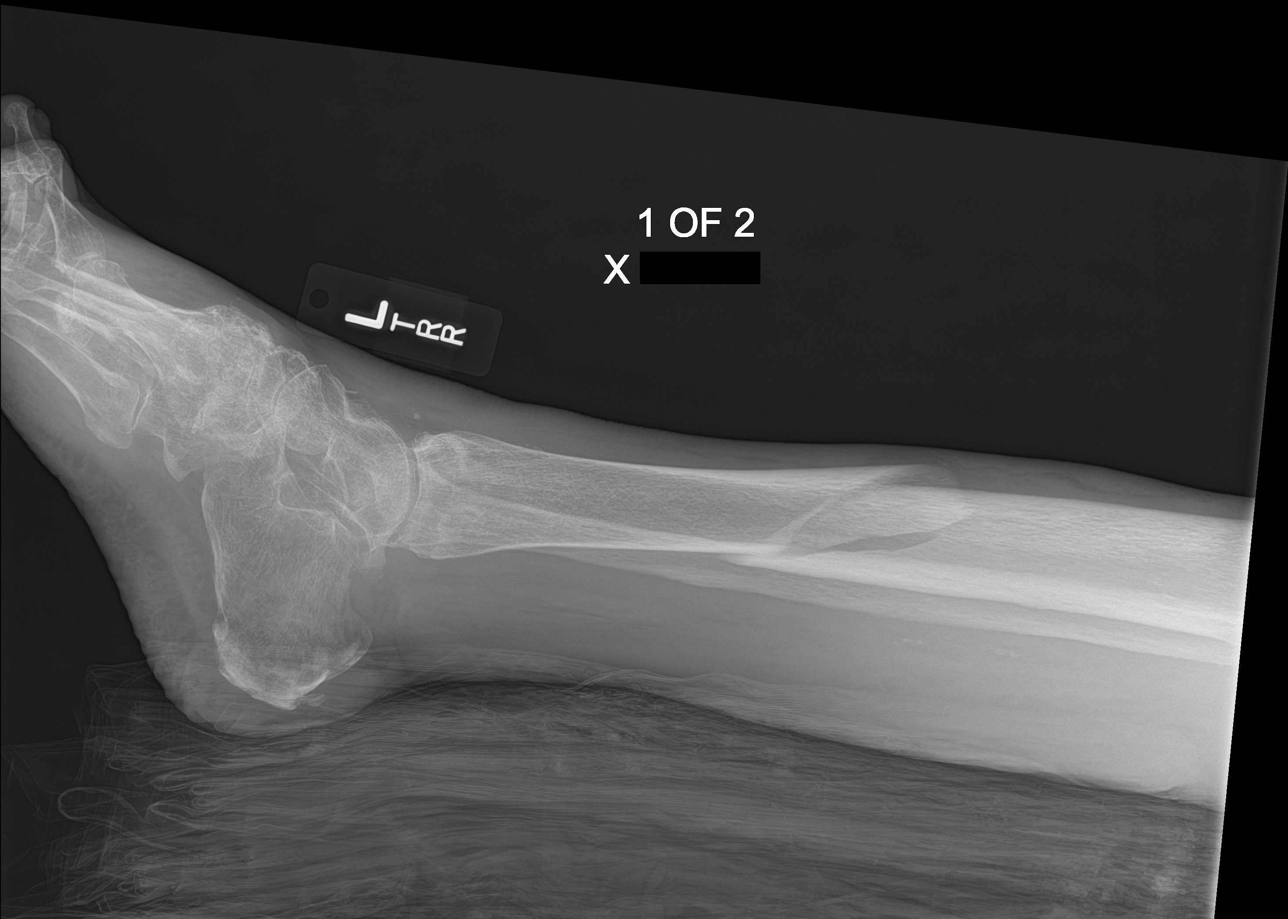
[im 4/4]
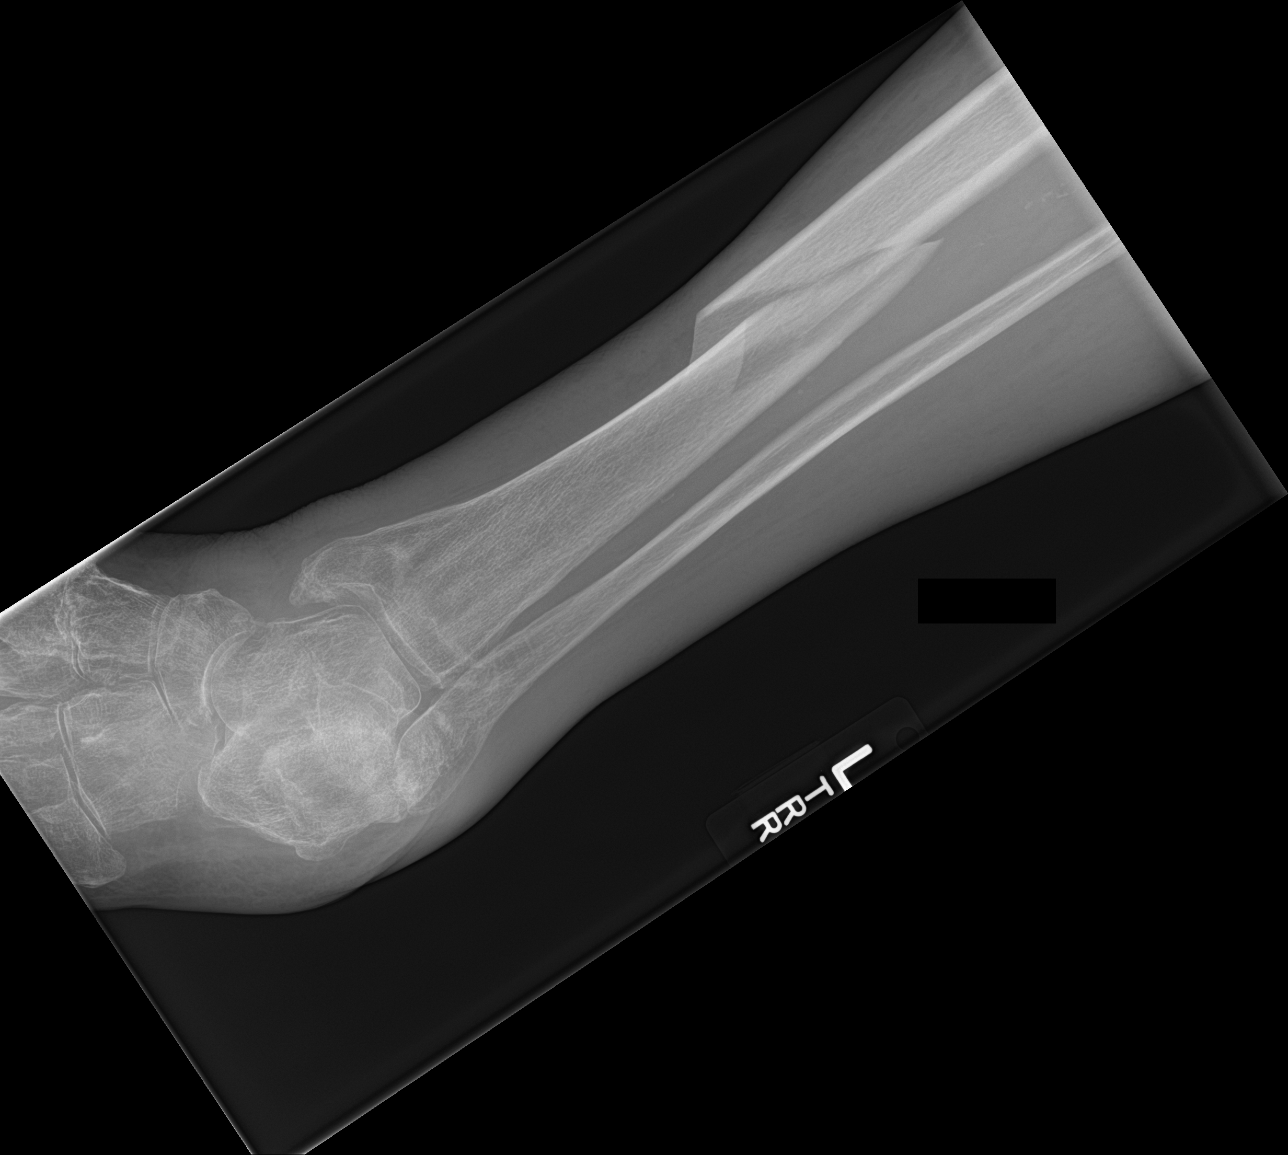

[4 of 4 positions shown; findings below may reference images not displayed]

FINDINGS: There are mildly displaced oblique fractures involving the distal
tibial diaphysis and proximal fibular diaphysis. There is lateral
and anterior displacement of the tibial fracture, and medial
displacement at the fibular fracture. Mild posterior angulation of
the distal tibia is noted. Mild soft tissue swelling is noted.

There is degenerative change at the left knee, with lateral
compartment narrowing and sclerotic change. The ankle mortise is
grossly unremarkable in appearance. Mild degenerative change is
noted at the midfoot. A posterior calcaneal spur is incidentally
seen.
IMPRESSION: 1. Mildly displaced oblique fractures involving the distal tibial
diaphysis and proximal fibular diaphysis. Lateral and anterior
angulation of the tibial fracture, and medial displacement of the
fibular fracture. Mild posterior angulation of the distal tibia.
2. Degenerative change at the left knee, with lateral compartment
narrowing and sclerotic change. Mild degenerative change at the
midfoot.
